# Patient Record
Sex: Male | Born: 1940
Health system: Southern US, Community
[De-identification: ages and names within clinical notes are randomized; demographics above are authoritative.]

## PROBLEM LIST (undated history)

## (undated) DIAGNOSIS — R413 Other amnesia: Secondary | ICD-10-CM

## (undated) DIAGNOSIS — F0390 Unspecified dementia without behavioral disturbance: Secondary | ICD-10-CM

## (undated) DIAGNOSIS — H269 Unspecified cataract: Secondary | ICD-10-CM

## (undated) DIAGNOSIS — C801 Malignant (primary) neoplasm, unspecified: Secondary | ICD-10-CM

## (undated) DIAGNOSIS — E785 Hyperlipidemia, unspecified: Secondary | ICD-10-CM

## (undated) DIAGNOSIS — E039 Hypothyroidism, unspecified: Secondary | ICD-10-CM

## (undated) DIAGNOSIS — C61 Malignant neoplasm of prostate: Secondary | ICD-10-CM

## (undated) DIAGNOSIS — K219 Gastro-esophageal reflux disease without esophagitis: Secondary | ICD-10-CM

## (undated) DIAGNOSIS — K635 Polyp of colon: Secondary | ICD-10-CM

## (undated) HISTORY — DX: Other amnesia: R41.3

## (undated) HISTORY — DX: Unspecified cataract: H26.9

## (undated) HISTORY — DX: Hyperlipidemia, unspecified: E78.5

## (undated) HISTORY — PX: OTHER SURGICAL HISTORY: SHX169

---

## 2005-07-04 ENCOUNTER — Ambulatory Visit (HOSPITAL_COMMUNITY): Admission: RE | Admit: 2005-07-04 | Discharge: 2005-07-04 | Payer: Self-pay | Admitting: Internal Medicine

## 2005-07-04 ENCOUNTER — Encounter (INDEPENDENT_AMBULATORY_CARE_PROVIDER_SITE_OTHER): Payer: Self-pay | Admitting: Internal Medicine

## 2005-07-04 ENCOUNTER — Ambulatory Visit: Payer: Self-pay | Admitting: Internal Medicine

## 2009-12-20 DIAGNOSIS — N529 Male erectile dysfunction, unspecified: Secondary | ICD-10-CM | POA: Insufficient documentation

## 2009-12-20 DIAGNOSIS — H9113 Presbycusis, bilateral: Secondary | ICD-10-CM | POA: Insufficient documentation

## 2009-12-20 DIAGNOSIS — M87051 Idiopathic aseptic necrosis of right femur: Secondary | ICD-10-CM | POA: Insufficient documentation

## 2010-03-22 DIAGNOSIS — M150 Primary generalized (osteo)arthritis: Secondary | ICD-10-CM | POA: Insufficient documentation

## 2010-07-12 ENCOUNTER — Ambulatory Visit: Admit: 2010-07-12 | Payer: Self-pay | Admitting: Gastroenterology

## 2010-07-25 ENCOUNTER — Encounter (INDEPENDENT_AMBULATORY_CARE_PROVIDER_SITE_OTHER): Payer: Self-pay

## 2010-07-25 DIAGNOSIS — I7300 Raynaud's syndrome without gangrene: Secondary | ICD-10-CM | POA: Insufficient documentation

## 2010-07-25 DIAGNOSIS — K449 Diaphragmatic hernia without obstruction or gangrene: Secondary | ICD-10-CM | POA: Insufficient documentation

## 2010-07-25 DIAGNOSIS — K2100 Gastro-esophageal reflux disease with esophagitis, without bleeding: Secondary | ICD-10-CM | POA: Insufficient documentation

## 2010-07-25 DIAGNOSIS — E7800 Pure hypercholesterolemia, unspecified: Secondary | ICD-10-CM | POA: Insufficient documentation

## 2010-07-26 DIAGNOSIS — R972 Elevated prostate specific antigen [PSA]: Secondary | ICD-10-CM | POA: Insufficient documentation

## 2010-08-17 NOTE — Letter (Signed)
Summary: Recall, Screening Colonoscopy Only  [facility]  [address]   [place], [place] [address]   Phone: [phone]  Fax: [phone]    July 25, 2010  Christian Johnson [address] [facility] [address] [place], [place]  [address] May 19, 1941   Dear Christian Johnson,   Our records indicate it is time to schedule your colonoscopy.    Please call our office at [phone] and ask for the nurse.   Thank you,  Hendricks Limes, LPN Cloria Spring, LPN  [facility] Ph: [phone]   Fax: [phone]

## 2010-12-01 NOTE — Op Note (Signed)
Christian Johnson, Christian Johnson                 ACCOUNT NO.:  [id number]   MEDICAL RECORD NO.:  [id number]          PATIENT TYPE:  AMB   LOCATION:  DAY                           FACILITY:  [facility]   PHYSICIAN:  Lionel December, M.D.    DATE OF BIRTH:  01-30-1941   DATE OF PROCEDURE:  07/04/2005  DATE OF DISCHARGE:                                 OPERATIVE REPORT   PROCEDURE:  Colonoscopy with polypectomy.   INDICATION:  [name] is a 70 year old Caucasian male who is here for screening  colonoscopy. Family history is negative for colorectal carcinoma.   Procedure and risks were reviewed with the patient, and informed consent was  obtained.   MEDICINES FOR CONSCIOUS SEDATION:  Demerol 25 mg IV, Versed 4 mg IV.   FINDINGS:  Procedure performed in endoscopy suite. The patient's vital signs  and O2 saturation were monitored during the procedure and remained stable.  The patient was placed in left lateral position and rectal examination  performed. No abnormality noted on external or digital exam. Olympus  videoscope was placed in rectum and advanced under vision in sigmoid colon  and beyond. Preparation was excellent. Scattered diverticula were noted  throughout the colon, but most of these were in the sigmoid colon. The scope  was passed in the cecum which was identified by ileocecal valve and  appendiceal orifice. Pictures taken for the record. As the scope was  withdrawn, colonic mucosa was carefully examined. There was a single 8-mm  pedunculated polyp at distal sigmoid colon which was snared and retrieved  for histologic examination. Rectal mucosa was normal. Scope was retroflexed  to examine anorectal junction, and small hemorrhoids were noted below the  dentate line. Endoscope was straightened and withdrawn. The patient  tolerated the procedure well.   FINAL DIAGNOSIS:  1.  An 8-mm pedunculated polyp snared from distal sigmoid colon.  2.  Pan colonic diverticulosis. Most of the diverticula,  however, were at      sigmoid colon.  3.  Small external hemorrhoids.   RECOMMENDATIONS:  High-fiber diet. He will resume his aspirin in one week  from now. I will be contacting the patient with biopsy results and further  recommendations.      Lionel December, M.D.  Electronically Signed     NR/MEDQ  D:  07/04/2005  T:  07/05/2005  Job:  [id number]

## 2011-01-01 ENCOUNTER — Other Ambulatory Visit (HOSPITAL_COMMUNITY): Payer: Self-pay | Admitting: Family Medicine

## 2011-01-01 DIAGNOSIS — Z87891 Personal history of nicotine dependence: Secondary | ICD-10-CM

## 2011-01-01 DIAGNOSIS — IMO0001 Reserved for inherently not codable concepts without codable children: Secondary | ICD-10-CM

## 2011-01-04 ENCOUNTER — Other Ambulatory Visit (HOSPITAL_COMMUNITY): Payer: Self-pay | Admitting: Family Medicine

## 2011-01-04 ENCOUNTER — Ambulatory Visit (HOSPITAL_COMMUNITY)
Admission: RE | Admit: 2011-01-04 | Discharge: 2011-01-04 | Disposition: A | Discharge status: Home / Self Care | Payer: Medicare Other | Source: Ambulatory Visit | Attending: Family Medicine | Admitting: Family Medicine

## 2011-01-04 DIAGNOSIS — IMO0001 Reserved for inherently not codable concepts without codable children: Secondary | ICD-10-CM

## 2011-01-04 DIAGNOSIS — R109 Unspecified abdominal pain: Secondary | ICD-10-CM | POA: Insufficient documentation

## 2011-01-04 DIAGNOSIS — Z87891 Personal history of nicotine dependence: Secondary | ICD-10-CM

## 2011-01-04 DIAGNOSIS — K8020 Calculus of gallbladder without cholecystitis without obstruction: Secondary | ICD-10-CM | POA: Insufficient documentation

## 2011-01-13 DIAGNOSIS — K8020 Calculus of gallbladder without cholecystitis without obstruction: Secondary | ICD-10-CM | POA: Insufficient documentation

## 2011-02-28 ENCOUNTER — Encounter (INDEPENDENT_AMBULATORY_CARE_PROVIDER_SITE_OTHER): Payer: Self-pay | Admitting: *Deleted

## 2011-11-19 ENCOUNTER — Encounter (INDEPENDENT_AMBULATORY_CARE_PROVIDER_SITE_OTHER): Payer: Self-pay | Admitting: *Deleted

## 2011-11-23 ENCOUNTER — Encounter (INDEPENDENT_AMBULATORY_CARE_PROVIDER_SITE_OTHER): Payer: Self-pay | Admitting: *Deleted

## 2011-11-23 ENCOUNTER — Telehealth (INDEPENDENT_AMBULATORY_CARE_PROVIDER_SITE_OTHER): Payer: Self-pay | Admitting: *Deleted

## 2011-11-23 ENCOUNTER — Other Ambulatory Visit (INDEPENDENT_AMBULATORY_CARE_PROVIDER_SITE_OTHER): Payer: Self-pay | Admitting: *Deleted

## 2011-11-23 DIAGNOSIS — Z86010 Personal history of colonic polyps: Secondary | ICD-10-CM

## 2011-11-23 MED ORDER — PEG-KCL-NACL-NASULF-NA ASC-C 100 G PO SOLR: 1.0000 | Freq: Once | ORAL | Status: DC

## 2011-11-23 NOTE — Telephone Encounter (Signed)
Patient needs movi prep 

## 2011-12-06 ENCOUNTER — Telehealth (INDEPENDENT_AMBULATORY_CARE_PROVIDER_SITE_OTHER): Payer: Self-pay | Admitting: *Deleted

## 2011-12-06 MED ORDER — PEG 3350-KCL-NA BICARB-NACL 420 G PO SOLR: 4000.0000 mL | Freq: Once | ORAL | Status: AC

## 2011-12-06 NOTE — Telephone Encounter (Signed)
Patient wants tri-lyte, it's cheaper than movi prep

## 2012-01-03 ENCOUNTER — Telehealth (INDEPENDENT_AMBULATORY_CARE_PROVIDER_SITE_OTHER): Payer: Self-pay | Admitting: *Deleted

## 2012-01-03 NOTE — Telephone Encounter (Signed)
PCP/Requesting MD: Wonda Horner  Name & DOB: Christian Johnson     Procedure: tcs  Reason/Indication:  Hx polyps  Has patient had this procedure before?  yes  If so, when, by whom and where?  12/2004  Is there a family history of colon cancer?  no  Who?  What age when diagnosed?    Is patient diabetic?   no      Does patient have prosthetic heart valve?  no  Do you have a pacemaker?  no  Has patient had joint replacement within last 12 months?  no  Is patient on Coumadin, Plavix and/or Aspirin? yes  Medications: asa 81 mg prn, omeprazole 20 mg daily, levothyroxine 88 mg daily, centrum silver, mega red fish oil   Allergies: none  Medication Adjustment: asa 2 days  Procedure date & time: 01/31/12 at 930

## 2012-01-04 NOTE — Telephone Encounter (Signed)
agree

## 2012-01-16 DIAGNOSIS — C61 Malignant neoplasm of prostate: Secondary | ICD-10-CM | POA: Insufficient documentation

## 2012-01-18 ENCOUNTER — Encounter (HOSPITAL_COMMUNITY): Payer: Self-pay | Admitting: Pharmacy Technician

## 2012-01-31 ENCOUNTER — Encounter (HOSPITAL_COMMUNITY)
Admission: RE | Disposition: A | Discharge status: Home Health | Payer: Self-pay | Source: Ambulatory Visit | Attending: Internal Medicine

## 2012-01-31 ENCOUNTER — Encounter (HOSPITAL_COMMUNITY): Payer: Self-pay | Admitting: *Deleted

## 2012-01-31 ENCOUNTER — Ambulatory Visit (HOSPITAL_COMMUNITY)
Admission: RE | Admit: 2012-01-31 | Discharge: 2012-01-31 | Disposition: A | Discharge status: Home Health | Payer: Medicare Other | Source: Ambulatory Visit | Attending: Internal Medicine | Admitting: Internal Medicine

## 2012-01-31 DIAGNOSIS — K644 Residual hemorrhoidal skin tags: Secondary | ICD-10-CM | POA: Insufficient documentation

## 2012-01-31 DIAGNOSIS — D126 Benign neoplasm of colon, unspecified: Secondary | ICD-10-CM | POA: Insufficient documentation

## 2012-01-31 DIAGNOSIS — Z8371 Family history of colonic polyps: Secondary | ICD-10-CM

## 2012-01-31 DIAGNOSIS — K5730 Diverticulosis of large intestine without perforation or abscess without bleeding: Secondary | ICD-10-CM | POA: Insufficient documentation

## 2012-01-31 DIAGNOSIS — Z860100 Personal history of colon polyps, unspecified: Secondary | ICD-10-CM | POA: Insufficient documentation

## 2012-01-31 DIAGNOSIS — K635 Polyp of colon: Secondary | ICD-10-CM | POA: Insufficient documentation

## 2012-01-31 DIAGNOSIS — Z09 Encounter for follow-up examination after completed treatment for conditions other than malignant neoplasm: Secondary | ICD-10-CM | POA: Insufficient documentation

## 2012-01-31 HISTORY — DX: Malignant (primary) neoplasm, unspecified: C80.1

## 2012-01-31 HISTORY — DX: Hypothyroidism, unspecified: E03.9

## 2012-01-31 HISTORY — DX: Polyp of colon: K63.5

## 2012-01-31 HISTORY — DX: Gastro-esophageal reflux disease without esophagitis: K21.9

## 2012-01-31 HISTORY — PX: COLONOSCOPY: SHX5424

## 2012-01-31 SURGERY — COLONOSCOPY
Anesthesia: Moderate Sedation

## 2012-01-31 MED ORDER — SODIUM CHLORIDE 0.45 % IV SOLN
Freq: Once | INTRAVENOUS | Status: AC
  Administered 2012-01-31: 09:00:00 via INTRAVENOUS

## 2012-01-31 MED ORDER — MEPERIDINE HCL 50 MG/ML IJ SOLN
INTRAMUSCULAR | Status: DC | PRN
  Administered 2012-01-31: 25 mg via INTRAVENOUS

## 2012-01-31 MED ORDER — MEPERIDINE HCL 50 MG/ML IJ SOLN
INTRAMUSCULAR | Status: AC
  Filled 2012-01-31: qty 1

## 2012-01-31 MED ORDER — STERILE WATER FOR IRRIGATION IR SOLN
Status: DC | PRN
  Administered 2012-01-31: 10:00:00

## 2012-01-31 MED ORDER — MIDAZOLAM HCL 5 MG/5ML IJ SOLN
INTRAMUSCULAR | Status: AC
  Filled 2012-01-31: qty 10

## 2012-01-31 MED ORDER — MIDAZOLAM HCL 5 MG/5ML IJ SOLN
INTRAMUSCULAR | Status: DC | PRN
  Administered 2012-01-31: 1 mg via INTRAVENOUS
  Administered 2012-01-31 (×2): 2 mg via INTRAVENOUS

## 2012-01-31 NOTE — H&P (Signed)
Christian Johnson is an 71 y.o. male.   Chief Complaint: Patient is here for colonoscopy. HPI: Patient is 71 year old Caucasian male with history of colonic polyps. His last exam was in 2006 with removal of single tubulovillous adenoma. He is here for surveillance colonoscopy. He denies abdominal pain change in his bowel habits or rectal bleeding. Family history is negative for colorectal carcinoma.  Past Medical History  Diagnosis Date  . Hypothyroidism   . GERD (gastroesophageal reflux disease)   . Colon polyps   . Cancer     Prostate    Past Surgical History  Procedure Date  . Right hip core decompression   . Colonoscopy with polyp removal     Family History  Problem Relation Age of Onset  . Colon cancer Neg Hx    Social History:  reports that he has quit smoking. His smoking use included Cigarettes. He has a 25 pack-year smoking history. He does not have any smokeless tobacco history on file. He reports that he does not drink alcohol or use illicit drugs.  Allergies: No Known Allergies  Medications Prior to Admission  Medication Sig Dispense Refill  . Krill Oil 500 MG CAPS Take 1 capsule by mouth daily.      Marland Kitchen levothyroxine (SYNTHROID, LEVOTHROID) 88 MCG tablet Take 88 mcg by mouth daily.      . Multiple Vitamin (MULTIVITAMIN WITH MINERALS) TABS Take 1 tablet by mouth daily.      Marland Kitchen omeprazole (PRILOSEC) 20 MG capsule Take 20 mg by mouth daily.      . peg 3350 powder (MOVIPREP) 100 G SOLR Take 1 kit (100 g total) by mouth once.  1 kit  0    No results found for this or any previous visit (from the past 48 hour(s)). No results found.  ROS  Blood pressure 175/85, pulse 86, temperature 97.9 F (36.6 C), temperature source Oral, resp. rate 21, height 6\' 1"  (1.854 m), weight 185 lb (83.915 kg), SpO2 95.00%. Physical Exam  Constitutional: He appears well-developed and well-nourished.  HENT:  Mouth/Throat: Oropharynx is clear and moist.  Eyes: Conjunctivae are normal. No  scleral icterus.  Neck: No thyromegaly present.  Cardiovascular: Normal rate, regular rhythm and normal heart sounds.   No murmur heard. Respiratory: Effort normal and breath sounds normal.  GI: Soft. He exhibits no distension and no mass. There is no tenderness.  Musculoskeletal: He exhibits no edema.  Lymphadenopathy:    He has no cervical adenopathy.  Neurological: He is alert.  Skin: Skin is warm and dry.     Assessment/Plan History of colonic adenoma. Surveillance colonoscopy  Khaylee Mcevoy U 01/31/2012, 9:48 AM

## 2012-01-31 NOTE — Op Note (Signed)
COLONOSCOPY PROCEDURE REPORT  PATIENT:  Christian Johnson  MR#:  [id number] Birthdate:  02/06/1941, 71 y.o., male Endoscopist:  Dr. Malissa Hippo, MD Referred By:  Dr. Mila Homer. Sudie Bailey, M.D. Procedure Date: 01/31/2012  Procedure:   Colonoscopy snare polypectomy.  Indications:  Patient is 71 year old Caucasian male with history of colonic adenoma. He is undergoing surveillance colonoscopy.  Informed Consent:  The procedure and risks were reviewed with the patient and informed consent was obtained.  Medications:  Demerol 25 mg IV Versed 5 mg IV  Description of procedure:  After a digital rectal exam was performed, that colonoscope was advanced from the anus through the rectum and colon to the area of the cecum, ileocecal valve and appendiceal orifice. The cecum was deeply intubated. These structures were well-seen and photographed for the record. From the level of the cecum and ileocecal valve, the scope was slowly and cautiously withdrawn. The mucosal surfaces were carefully surveyed utilizing scope tip to flexion to facilitate fold flattening as needed. The scope was pulled down into the rectum where a thorough exam including retroflexion was performed.  Findings:   Prep excellent. Scattered diverticula throughout the colon. Three small polyps ablated via cold biopsy from hepatic flexure and submitted together. Four small polyps ablated via cold biopsy from transverse colon and submitted together. 10 mm polyp snared from distal sigmoid colon. Normal rectal mucosa. Small hemorrhoids below the dentate line.  Therapeutic/Diagnostic Maneuvers Performed:  See above.  Complications:  None  Cecal Withdrawal Time:  21 minutes  Impression:  Examination performed to cecum. Pancolonic diverticulosis. Three small polyps ablated via cold biopsy from hepatic flexure and submitted together. Four small polyps ablated via cold biopsy from transverse colon and submitted together. 10 mm polyp  snared from sigmoid colon. Small external hemorrhoids.   Recommendations:  Standard instructions given. I will contact patient with biopsy results and further recommendations.  Rainah Kirshner U  01/31/2012 10:39 AM  CC: Dr. Milana Obey, MD & Dr. Bonnetta Barry ref. provider found

## 2012-02-06 ENCOUNTER — Encounter (HOSPITAL_COMMUNITY): Payer: Self-pay | Admitting: Internal Medicine

## 2012-02-06 ENCOUNTER — Encounter (INDEPENDENT_AMBULATORY_CARE_PROVIDER_SITE_OTHER): Payer: Self-pay | Admitting: *Deleted

## 2012-03-03 ENCOUNTER — Encounter (HOSPITAL_COMMUNITY): Payer: Self-pay

## 2012-03-03 ENCOUNTER — Emergency Department (HOSPITAL_COMMUNITY): Payer: Medicare Other

## 2012-03-03 ENCOUNTER — Emergency Department (HOSPITAL_COMMUNITY)
Admission: EM | Admit: 2012-03-03 | Discharge: 2012-03-03 | Disposition: A | Discharge status: Home / Self Care | Payer: Medicare Other | Attending: Emergency Medicine | Admitting: Emergency Medicine

## 2012-03-03 DIAGNOSIS — Z87891 Personal history of nicotine dependence: Secondary | ICD-10-CM | POA: Insufficient documentation

## 2012-03-03 DIAGNOSIS — Z8546 Personal history of malignant neoplasm of prostate: Secondary | ICD-10-CM | POA: Insufficient documentation

## 2012-03-03 DIAGNOSIS — Y9289 Other specified places as the place of occurrence of the external cause: Secondary | ICD-10-CM | POA: Insufficient documentation

## 2012-03-03 DIAGNOSIS — E039 Hypothyroidism, unspecified: Secondary | ICD-10-CM | POA: Insufficient documentation

## 2012-03-03 DIAGNOSIS — S62109A Fracture of unspecified carpal bone, unspecified wrist, initial encounter for closed fracture: Secondary | ICD-10-CM | POA: Insufficient documentation

## 2012-03-03 DIAGNOSIS — R071 Chest pain on breathing: Secondary | ICD-10-CM | POA: Insufficient documentation

## 2012-03-03 DIAGNOSIS — R0789 Other chest pain: Secondary | ICD-10-CM

## 2012-03-03 DIAGNOSIS — S62101A Fracture of unspecified carpal bone, right wrist, initial encounter for closed fracture: Secondary | ICD-10-CM

## 2012-03-03 DIAGNOSIS — K219 Gastro-esophageal reflux disease without esophagitis: Secondary | ICD-10-CM | POA: Insufficient documentation

## 2012-03-03 DIAGNOSIS — W19XXXA Unspecified fall, initial encounter: Secondary | ICD-10-CM | POA: Insufficient documentation

## 2012-03-03 HISTORY — DX: Malignant neoplasm of prostate: C61

## 2012-03-03 MED ORDER — HYDROCODONE-ACETAMINOPHEN 5-325 MG PO TABS: 1.0000 | ORAL_TABLET | ORAL | Status: AC | PRN

## 2012-03-03 NOTE — ED Notes (Signed)
Slipped and fell on wet grass this afternoon around 1230. Hurting in right forearm, wrist, and hand. Fell on left side and landed on left arm with arm against chest. Hurting in left chest, hurts to laugh and take a deep breath per pt.

## 2012-03-03 NOTE — ED Provider Notes (Signed)
History     CSN: [id number]  Arrival date & time 03/03/12  1956   First MD Initiated Contact with Patient 03/03/12 2013      Chief Complaint  Patient presents with  . Fall    (Consider location/radiation/quality/duration/timing/severity/associated sxs/prior treatment) HPI...Marland Kitchenaccidental fall at 12:30 today on wet grass.  Right arm was tucked under his chest.  Complains of right wrist and right chest wall pain. Movement and deep breaths makes it worse. Pain is moderate. No head or neck trauma.  Past Medical History  Diagnosis Date  . Hypothyroidism   . GERD (gastroesophageal reflux disease)   . Colon polyps   . Cancer     Prostate  . Prostate cancer     Past Surgical History  Procedure Date  . Right hip core decompression   . Colonoscopy with polyp removal   . Colonoscopy 01/31/2012    Procedure: COLONOSCOPY;  Surgeon: Malissa Hippo, MD;  Location: [facility];  Service: Endoscopy;  Laterality: N/A;  930    Family History  Problem Relation Age of Onset  . Colon cancer Neg Hx     History  Substance Use Topics  . Smoking status: Former Smoker -- 1.0 packs/day for 25 years    Types: Cigarettes  . Smokeless tobacco: Not on file  . Alcohol Use: No      Review of Systems  All other systems reviewed and are negative.    Allergies  Review of patient's allergies indicates no known allergies.  Home Medications   Current Outpatient Rx  Name Route Sig Dispense Refill  . KRILL OIL 500 MG PO CAPS Oral Take 1 capsule by mouth daily.    Marland Kitchen LEVOTHYROXINE SODIUM 88 MCG PO TABS Oral Take 88 mcg by mouth every morning.     . ADULT MULTIVITAMIN W/MINERALS CH Oral Take 1 tablet by mouth daily.    Marland Kitchen NAPROXEN SODIUM 220 MG PO CAPS Oral Take 440 mg by mouth once as needed. For pain    . OMEPRAZOLE 20 MG PO CPDR Oral Take 20 mg by mouth every morning.     Marland Kitchen HYDROCODONE-ACETAMINOPHEN 5-325 MG PO TABS Oral Take 1 tablet by mouth every 4 (four) hours as needed for pain. 20  tablet 0    BP 160/81  Pulse 91  Temp 97.8 F (36.6 C) (Oral)  Resp 18  Ht 6\' 1"  (1.854 m)  Wt 188 lb (85.276 kg)  BMI 24.80 kg/m2  SpO2 97%  Physical Exam  Nursing note and vitals reviewed. Constitutional: He is oriented to person, place, and time. He appears well-developed and well-nourished.  HENT:  Head: Normocephalic and atraumatic.  Eyes: Conjunctivae and EOM are normal. Pupils are equal, round, and reactive to light.  Neck: Normal range of motion. Neck supple.  Cardiovascular: Normal rate and regular rhythm.   Pulmonary/Chest: Effort normal and breath sounds normal.       Tender right anterior lateral chest wall  Abdominal: Soft. Bowel sounds are normal.  Musculoskeletal:       Tender to palpation dorsum right wrist, pain with flexion and extension  Neurological: He is alert and oriented to person, place, and time.  Skin: Skin is warm and dry.  Psychiatric: He has a normal mood and affect.    ED Course  Procedures (including critical care time)  Labs Reviewed - No data to display Dg Ribs Unilateral W/chest Right  03/03/2012  *RADIOLOGY REPORT*  Clinical Data: Right anterior chest pain after fall.  History of  prostate cancer.  RIGHT RIBS AND CHEST - 3+ VIEW  Comparison: None.  Findings: The heart size and pulmonary vascularity are normal. The lungs appear clear and expanded without focal air space disease or consolidation. No blunting of the costophrenic angles.  No pneumothorax.  Slight fibrosis in the lung bases.  The mediastinal contours appear intact.  Right ribs appear intact.  No displaced fractures or focal bone lesions identified.  IMPRESSION: No evidence of active pulmonary disease.  Right ribs appear intact.   Original Report Authenticated By: Marlon Pel, M.D.    Dg Wrist Complete Right  03/03/2012  *RADIOLOGY REPORT*  Clinical Data: Right wrist pain and swelling after fall.  History of prostate cancer.  RIGHT WRIST - COMPLETE 3+ VIEW  Comparison: None.   Findings: Mild degenerative changes in the STT joints.  Dorsal soft tissue swelling over the wrist with suggestion of tiny linear fracture of the distal carpal bones, likely representing the hamate.  This suggests a nondisplaced fracture of the dorsal aspect of the hamate.  Right wrist appears otherwise intact.  No additional fractures identified. No focal bone lesion or sclerosis.  IMPRESSION: Nondisplaced fracture of a distal carpal bones seen dorsally, likely to represent the hamate.  Soft tissue swelling.   Original Report Authenticated By: Marlon Pel, M.D.      1. Fall   2. Chest wall pain   3. Right wrist fracture       MDM  X-ray right ribs negative. X-ray of right wrist shows possible hamate fracture.  Will splint rest, refer to orthopedics.        Donnetta Hutching, MD 03/03/12 2237

## 2012-12-31 DIAGNOSIS — L570 Actinic keratosis: Secondary | ICD-10-CM | POA: Insufficient documentation

## 2015-11-03 ENCOUNTER — Other Ambulatory Visit (HOSPITAL_COMMUNITY): Payer: Self-pay | Admitting: Orthopaedic Surgery

## 2015-11-03 DIAGNOSIS — R52 Pain, unspecified: Secondary | ICD-10-CM

## 2015-11-08 ENCOUNTER — Ambulatory Visit (HOSPITAL_COMMUNITY)
Admission: RE | Admit: 2015-11-08 | Discharge: 2015-11-08 | Disposition: A | Discharge status: Home / Self Care | Payer: Medicare Other | Source: Ambulatory Visit | Attending: Orthopaedic Surgery | Admitting: Orthopaedic Surgery

## 2015-11-08 DIAGNOSIS — R52 Pain, unspecified: Secondary | ICD-10-CM

## 2015-11-08 DIAGNOSIS — M7551 Bursitis of right shoulder: Secondary | ICD-10-CM | POA: Diagnosis not present

## 2015-11-08 DIAGNOSIS — M25811 Other specified joint disorders, right shoulder: Secondary | ICD-10-CM | POA: Diagnosis not present

## 2015-11-08 DIAGNOSIS — M19011 Primary osteoarthritis, right shoulder: Secondary | ICD-10-CM | POA: Insufficient documentation

## 2015-11-08 DIAGNOSIS — M25511 Pain in right shoulder: Secondary | ICD-10-CM | POA: Diagnosis present

## 2015-11-16 ENCOUNTER — Ambulatory Visit: Payer: Medicare Other | Admitting: Physical Therapy

## 2017-01-17 ENCOUNTER — Encounter (INDEPENDENT_AMBULATORY_CARE_PROVIDER_SITE_OTHER): Payer: Self-pay | Admitting: *Deleted

## 2017-01-31 ENCOUNTER — Other Ambulatory Visit (INDEPENDENT_AMBULATORY_CARE_PROVIDER_SITE_OTHER): Payer: Self-pay | Admitting: Internal Medicine

## 2017-01-31 DIAGNOSIS — Z860100 Personal history of colon polyps, unspecified: Secondary | ICD-10-CM | POA: Insufficient documentation

## 2017-04-16 ENCOUNTER — Encounter (INDEPENDENT_AMBULATORY_CARE_PROVIDER_SITE_OTHER): Payer: Self-pay | Admitting: *Deleted

## 2017-04-16 ENCOUNTER — Telehealth (INDEPENDENT_AMBULATORY_CARE_PROVIDER_SITE_OTHER): Payer: Self-pay | Admitting: *Deleted

## 2017-04-16 NOTE — Telephone Encounter (Signed)
Patient needs trilyte 

## 2017-04-17 MED ORDER — PEG 3350-KCL-NA BICARB-NACL 420 G PO SOLR: 4000.0000 mL | Freq: Once | ORAL | 0 refills | Status: AC

## 2017-05-10 ENCOUNTER — Telehealth (INDEPENDENT_AMBULATORY_CARE_PROVIDER_SITE_OTHER): Payer: Self-pay | Admitting: *Deleted

## 2017-05-10 NOTE — Telephone Encounter (Signed)
Referring MD/PCP: nyland   Procedure: tcs  Reason/Indication:  Hx polyps  Has patient had this procedure before?  Yes, 2013  If so, when, by whom and where?    Is there a family history of colon cancer?  no  Who?  What age when diagnosed?    Is patient diabetic?   no      Does patient have prosthetic heart valve or mechanical valve?  no  Do you have a pacemaker?  no  Has patient ever had endocarditis? no  Has patient had joint replacement within last 12 months?  no  Is patient constipated or take laxatives? no  Does patient have a history of alcohol/drug use?  no  Is patient on Coumadin, Plavix and/or Aspirin? no  Medications: omeprazole 20 mg daily, levothyroxine 88 mcg daily, fish oil  Allergies: nkda  Medication Adjustment per Dr Laural Golden:   Procedure date & time: 06/05/17 at 1030

## 2017-05-14 NOTE — Telephone Encounter (Signed)
agree

## 2017-06-05 ENCOUNTER — Encounter (HOSPITAL_COMMUNITY): Payer: Self-pay

## 2017-06-05 ENCOUNTER — Ambulatory Visit (HOSPITAL_COMMUNITY): Admit: 2017-06-05 | Payer: Medicare Other | Admitting: Internal Medicine

## 2017-06-05 SURGERY — COLONOSCOPY
Anesthesia: Moderate Sedation

## 2017-06-19 ENCOUNTER — Other Ambulatory Visit (INDEPENDENT_AMBULATORY_CARE_PROVIDER_SITE_OTHER): Payer: Self-pay | Admitting: *Deleted

## 2017-06-19 DIAGNOSIS — Z860100 Personal history of colon polyps, unspecified: Secondary | ICD-10-CM | POA: Insufficient documentation

## 2017-09-16 ENCOUNTER — Encounter (INDEPENDENT_AMBULATORY_CARE_PROVIDER_SITE_OTHER): Payer: Self-pay | Admitting: *Deleted

## 2017-09-16 ENCOUNTER — Telehealth (INDEPENDENT_AMBULATORY_CARE_PROVIDER_SITE_OTHER): Payer: Self-pay | Admitting: *Deleted

## 2017-09-16 MED ORDER — PEG 3350-KCL-NA BICARB-NACL 420 G PO SOLR: 4000.0000 mL | Freq: Once | ORAL | 0 refills | Status: AC

## 2017-09-16 NOTE — Telephone Encounter (Signed)
Patient needs trilyte 

## 2017-09-18 ENCOUNTER — Ambulatory Visit (INDEPENDENT_AMBULATORY_CARE_PROVIDER_SITE_OTHER): Payer: Medicare Other | Admitting: Orthopaedic Surgery

## 2017-09-18 ENCOUNTER — Ambulatory Visit (INDEPENDENT_AMBULATORY_CARE_PROVIDER_SITE_OTHER): Payer: Self-pay

## 2017-09-18 ENCOUNTER — Encounter (INDEPENDENT_AMBULATORY_CARE_PROVIDER_SITE_OTHER): Payer: Self-pay | Admitting: Orthopaedic Surgery

## 2017-09-18 VITALS — BP 175/95 | HR 70 | Resp 16 | Ht 72.0 in | Wt 180.0 lb

## 2017-09-18 DIAGNOSIS — M25511 Pain in right shoulder: Secondary | ICD-10-CM | POA: Diagnosis not present

## 2017-09-18 DIAGNOSIS — G8929 Other chronic pain: Secondary | ICD-10-CM

## 2017-09-18 MED ORDER — METHYLPREDNISOLONE ACETATE 40 MG/ML IJ SUSP
80.0000 mg | INTRAMUSCULAR | Status: AC | PRN
  Administered 2017-09-18: 80 mg

## 2017-09-18 MED ORDER — BUPIVACAINE HCL 0.5 % IJ SOLN
2.0000 mL | INTRAMUSCULAR | Status: AC | PRN
  Administered 2017-09-18: 2 mL via INTRA_ARTICULAR

## 2017-09-18 MED ORDER — LIDOCAINE HCL 2 % IJ SOLN
2.0000 mL | INTRAMUSCULAR | Status: AC | PRN
  Administered 2017-09-18: 2 mL

## 2017-09-18 NOTE — Progress Notes (Signed)
Office Visit Note   Patient: Christian Johnson           Date of Birth: Mar 04, 1941           MRN: [id number] Visit Date: 09/18/2017              Requested by: Christian Housekeeper, MD [address] [place], [place] [address] PCP: Christian Housekeeper, MD   Assessment & Plan: Visit Diagnoses:  1. Chronic right shoulder pain     Plan: Acute on chronic right shoulder pain. He certainly could have a rotator cuff tear. I'm going to inject the subacromial region of his right shoulder and monitor his response. Also having some numbness and tingling into his right hand was some weakness which may be related to his shoulder but he certainly could have a peripheral neuropathy. I would suggest EMGs and nerve conduction studies if the injection in his shoulder doesn't help over the next 3-4 weeks. He'll call  Follow-Up Instructions: Return if symptoms worsen or fail to improve.   Orders:  Orders Placed This Encounter  Procedures  . Large Joint Inj: R subacromial bursa  . XR Shoulder Right   No orders of the defined types were placed in this encounter.     Procedures: Large Joint Inj: R subacromial bursa on 09/18/2017 10:56 AM Indications: pain and diagnostic evaluation Details: 25 G 1.5 in needle, anterolateral approach  Arthrogram: No  Medications: 2 mL lidocaine 2 %; 2 mL bupivacaine 0.5 %; 80 mg methylPREDNISolone acetate 40 MG/ML Consent was given by the patient. Immediately prior to procedure a time out was called to verify the correct patient, procedure, equipment, support staff and site/side marked as required. Patient was prepped and draped in the usual sterile fashion.       Clinical Data: No additional findings.   Subjective: Chief Complaint  Patient presents with  . Right Shoulder - Pain    Christian Johnson is 77 y o m here for r shoulder pain, radiates down arm and causing tingling in in hand. Noticed it after painting 2 weeks ago. Pt. aslo states has pain at the base of the neck.    Accompanied by wife and here for evaluation of right upper extremity pain. Christian Johnson relates he was painting a wall with a roller brush about 2 months ago and had a "little shoulder pain. He's had a prior history of shoulder discomfort that I have it injected in the past with good relief. About a month ago he fell off a ladder and had recurrent pain in his right shoulder. He is not sure if painting walls or falling off a ladder is responsible for his present pain but for the past 2 weeks he's been having some shoulder pain with limited motion overhead. Has also developed some numbness and tingling into his right hand associated with some weakness. He denies any neck pain.  HPI  Review of Systems  Constitutional: Negative for fatigue and fever.  HENT: Negative for ear pain.   Eyes: Negative for pain.  Respiratory: Negative for cough.   Cardiovascular: Negative for leg swelling.  Gastrointestinal: Negative for constipation and diarrhea.  Genitourinary: Negative for dysuria.  Musculoskeletal: Positive for neck pain and neck stiffness. Negative for back pain.  Skin: Negative for rash.  Neurological: Positive for weakness and numbness. Negative for dizziness.  Psychiatric/Behavioral: Negative for sleep disturbance.     Objective: Vital Signs: BP (!) 175/95 (BP Location: Left Arm, Patient Position: Sitting, Cuff Size: Normal)  Pulse 70   Resp 16   Ht 6' (1.829 m)   Wt 180 lb (81.6 kg)   BMI 24.41 kg/m   Physical Exam  Ortho Exam awake alert and oriented 3. Comfortable sitting. No pain with range of motion of the cervical spine below was a little limited. At no referred pain to the right shoulder. Minimally painful motion of right shoulder overhead. No pain at the acromioclavicular joint. Skin intact. No swelling about the right shoulder. Negative empty can testing minimal discomfort with impingement. Reasonable grip and release lower thought he was weak with opposition of thumb to his  little finger. Some tingling over the median nerve. Good sensibility good capillary refill to the fingers.  Specialty Comments:  No specialty comments available.  Imaging: Xr Shoulder Right  Result Date: 09/18/2017 Films of the right shoulder reveal no evidence of fracture or dislocation. There is a normal space between the humeral head and acromion. There are degenerative changes at the acromioclavicular joint. No ectopic calcification.    PMFS History: Patient Active Problem List   Diagnosis Date Noted  . History of colonic polyps 06/19/2017  . Hx of colonic polyps 01/31/2017   Past Medical History:  Diagnosis Date  . Cancer [facility])    Prostate  . Colon polyps   . GERD (gastroesophageal reflux disease)   . Hypothyroidism   . Prostate cancer [facility])     Family History  Problem Relation Age of Onset  . Colon cancer Neg Hx     Past Surgical History:  Procedure Laterality Date  . COLONOSCOPY  01/31/2012   Procedure: COLONOSCOPY;  Surgeon: Christian Houston, MD;  Location: [facility];  Service: Endoscopy;  Laterality: N/A;  930  . Colonoscopy with polyp removal    . Right hip core decompression     Social History   Occupational History  . Not on file  Tobacco Use  . Smoking status: Former Smoker    Packs/day: 1.00    Years: 25.00    Pack years: 25.00    Types: Cigarettes  Substance and Sexual Activity  . Alcohol use: No  . Drug use: No  . Sexual activity: Not on file

## 2017-10-09 ENCOUNTER — Telehealth (INDEPENDENT_AMBULATORY_CARE_PROVIDER_SITE_OTHER): Payer: Self-pay | Admitting: *Deleted

## 2017-10-09 NOTE — Telephone Encounter (Signed)
Referring MD/PCP: nyland   Procedure: tcs  Reason/Indication:  Hx polyps  Has patient had this procedure before?  Yes, 2013             If so, when, by whom and where?    Is there a family history of colon cancer?  no             Who?  What age when diagnosed?    Is patient diabetic?   no                                                  Does patient have prosthetic heart valve or mechanical valve?  no  Do you have a pacemaker?  no  Has patient ever had endocarditis? no  Has patient had joint replacement within last 12 months?  no  Is patient constipated or take laxatives? no  Does patient have a history of alcohol/drug use?  no  Is patient on Coumadin, Plavix and/or Aspirin? no  Medications: omeprazole 20 mg daily, levothyroxine 88 mcg daily, fish oil  Allergies: nkda  Medication Adjustment per Dr Laural Golden:   Procedure date & time: 11/06/17 at 830

## 2017-10-09 NOTE — Telephone Encounter (Signed)
agree

## 2017-10-14 ENCOUNTER — Other Ambulatory Visit (INDEPENDENT_AMBULATORY_CARE_PROVIDER_SITE_OTHER): Payer: Self-pay | Admitting: Radiology

## 2017-10-14 ENCOUNTER — Telehealth (INDEPENDENT_AMBULATORY_CARE_PROVIDER_SITE_OTHER): Payer: Self-pay | Admitting: Orthopaedic Surgery

## 2017-10-14 DIAGNOSIS — M79601 Pain in right arm: Secondary | ICD-10-CM

## 2017-10-14 NOTE — Telephone Encounter (Signed)
PLEASE ADVISE.

## 2017-10-14 NOTE — Progress Notes (Signed)
amb ref °

## 2017-10-14 NOTE — Telephone Encounter (Signed)
Whichever is more convenient

## 2017-10-14 NOTE — Telephone Encounter (Signed)
Patient's wife Christian Johnson called stating that Ulrich saw Dr. Durward Fortes on 3/6 in [place].   Patient is not sure if he should make an appointment in [place] or [place] based on needing further testing.

## 2017-10-14 NOTE — Telephone Encounter (Signed)
Yes to nerve EMG's and NCV's right arm

## 2017-10-14 NOTE — Telephone Encounter (Signed)
Would like to get nerve test that was discussed at last appt. on 09-18-17. Would you like to see them in [place] office first or can we just send in the referral? Please advise. Thank you.

## 2017-10-15 NOTE — Telephone Encounter (Signed)
SENT IN REFERRAL FOR EMG AND VCV'S

## 2017-11-06 ENCOUNTER — Ambulatory Visit (HOSPITAL_COMMUNITY)
Admission: RE | Admit: 2017-11-06 | Discharge: 2017-11-06 | Disposition: A | Discharge status: Home / Self Care | Payer: Medicare Other | Source: Ambulatory Visit | Attending: Internal Medicine | Admitting: Internal Medicine

## 2017-11-06 ENCOUNTER — Other Ambulatory Visit: Payer: Self-pay

## 2017-11-06 ENCOUNTER — Encounter (HOSPITAL_COMMUNITY)
Admission: RE | Disposition: A | Discharge status: Home / Self Care | Payer: Self-pay | Source: Ambulatory Visit | Attending: Internal Medicine

## 2017-11-06 ENCOUNTER — Encounter (HOSPITAL_COMMUNITY): Payer: Self-pay | Admitting: *Deleted

## 2017-11-06 DIAGNOSIS — Z79899 Other long term (current) drug therapy: Secondary | ICD-10-CM | POA: Diagnosis not present

## 2017-11-06 DIAGNOSIS — Z87891 Personal history of nicotine dependence: Secondary | ICD-10-CM | POA: Insufficient documentation

## 2017-11-06 DIAGNOSIS — E039 Hypothyroidism, unspecified: Secondary | ICD-10-CM | POA: Insufficient documentation

## 2017-11-06 DIAGNOSIS — Z8546 Personal history of malignant neoplasm of prostate: Secondary | ICD-10-CM | POA: Diagnosis not present

## 2017-11-06 DIAGNOSIS — Z1211 Encounter for screening for malignant neoplasm of colon: Secondary | ICD-10-CM | POA: Diagnosis present

## 2017-11-06 DIAGNOSIS — K5730 Diverticulosis of large intestine without perforation or abscess without bleeding: Secondary | ICD-10-CM | POA: Insufficient documentation

## 2017-11-06 DIAGNOSIS — K621 Rectal polyp: Secondary | ICD-10-CM | POA: Insufficient documentation

## 2017-11-06 DIAGNOSIS — D120 Benign neoplasm of cecum: Secondary | ICD-10-CM | POA: Insufficient documentation

## 2017-11-06 DIAGNOSIS — Z86010 Personal history of colonic polyps: Secondary | ICD-10-CM

## 2017-11-06 DIAGNOSIS — Z09 Encounter for follow-up examination after completed treatment for conditions other than malignant neoplasm: Secondary | ICD-10-CM | POA: Diagnosis not present

## 2017-11-06 DIAGNOSIS — D123 Benign neoplasm of transverse colon: Secondary | ICD-10-CM | POA: Insufficient documentation

## 2017-11-06 HISTORY — PX: POLYPECTOMY: SHX5525

## 2017-11-06 HISTORY — PX: COLONOSCOPY: SHX5424

## 2017-11-06 SURGERY — COLONOSCOPY
Anesthesia: Moderate Sedation

## 2017-11-06 MED ORDER — SODIUM CHLORIDE 0.9 % IV SOLN
INTRAVENOUS | Status: DC
  Administered 2017-11-06: 1000 mL via INTRAVENOUS

## 2017-11-06 MED ORDER — MEPERIDINE HCL 50 MG/ML IJ SOLN
INTRAMUSCULAR | Status: AC
  Filled 2017-11-06: qty 1

## 2017-11-06 MED ORDER — MIDAZOLAM HCL 5 MG/5ML IJ SOLN
INTRAMUSCULAR | Status: AC
  Filled 2017-11-06: qty 10

## 2017-11-06 MED ORDER — STERILE WATER FOR IRRIGATION IR SOLN
Status: DC | PRN
  Administered 2017-11-06: 15 mL

## 2017-11-06 MED ORDER — MEPERIDINE HCL 50 MG/ML IJ SOLN
INTRAMUSCULAR | Status: DC | PRN
  Administered 2017-11-06 (×2): 25 mg via INTRAVENOUS

## 2017-11-06 MED ORDER — MIDAZOLAM HCL 5 MG/5ML IJ SOLN
INTRAMUSCULAR | Status: DC | PRN
  Administered 2017-11-06: 1 mg via INTRAVENOUS
  Administered 2017-11-06: 2 mg via INTRAVENOUS
  Administered 2017-11-06: 1 mg via INTRAVENOUS

## 2017-11-06 NOTE — Op Note (Signed)
[facility] Patient Name: Christian Johnson Procedure Date: 11/06/2017 8:21 AM MRN: [id number] Date of Birth: [birth year] Attending MD: Hildred Laser , MD CSN: [id number] Age: 77 Admit Type: Outpatient Procedure:                Colonoscopy Indications:              High risk colon cancer surveillance: Personal                            history of colonic polyps Providers:                Hildred Laser, MD, Charlsie Quest. Theda Sers RN, RN, Nelma Rothman, Technician Referring MD:             Dione Housekeeper, MD Medicines:                Meperidine 50 mg IV, Midazolam 4 mg IV Complications:            No immediate complications. Estimated Blood Loss:     Estimated blood loss was minimal. Procedure:                Pre-Anesthesia Assessment:                           - Prior to the procedure, a History and Physical                            was performed, and patient medications and                            allergies were reviewed. The patient's tolerance of                            previous anesthesia was also reviewed. The risks                            and benefits of the procedure and the sedation                            options and risks were discussed with the patient.                            All questions were answered, and informed consent                            was obtained. Prior Anticoagulants: The patient has                            taken no previous anticoagulant or antiplatelet                            agents. ASA Grade Assessment: I - A normal, healthy  patient. After reviewing the risks and benefits,                            the patient was deemed in satisfactory condition to                            undergo the procedure.                           After obtaining informed consent, the colonoscope                            was passed under direct vision. Throughout the                            procedure,  the patient's blood pressure, pulse, and                            oxygen saturations were monitored continuously. The                            EC-[id number] ([id number]) scope was introduced through                            the anus and advanced to the the cecum, identified                            by appendiceal orifice and ileocecal valve. The                            colonoscopy was performed without difficulty. The                            patient tolerated the procedure well. The quality                            of the bowel preparation was adequate. The                            ileocecal valve, appendiceal orifice, and rectum                            were photographed. Scope In: 8:38:30 AM Scope Out: 9:01:18 AM Scope Withdrawal Time: 0 hours 13 minutes 52 seconds  Total Procedure Duration: 0 hours 22 minutes 48 seconds  Findings:      The perianal and digital rectal examinations were normal.      Two sessile polyps were found in the proximal transverse colon and       cecum. The polyps were small in size. These were biopsied with a cold       forceps for histology. The pathology specimen was placed into Bottle       Number 1.      A 8 mm polyp was found in the rectum. The polyp was sessile. The polyp       was  removed with a piecemeal technique using a hot snare. Resection and       retrieval were complete. The pathology specimen was placed into Bottle       Number 2.      Multiple small and large-mouthed diverticula were found in the sigmoid       colon, descending colon and hepatic flexure.      The retroflexed view of the distal rectum and anal verge was normal and       showed no anal or rectal abnormalities. Impression:               - Two small polyps in the proximal transverse colon                            and in the cecum. Biopsied.                           - One 8 mm polyp in the rectum, removed piecemeal                            using a hot snare.  Resected and retrieved.                           - Diverticulosis in the sigmoid colon, in the                            descending colon and at the hepatic flexure.                           - The distal rectum and anal verge are normal on                            retroflexion view. Moderate Sedation:      Moderate (conscious) sedation was administered by the endoscopy nurse       and supervised by the endoscopist. The following parameters were       monitored: oxygen saturation, heart rate, blood pressure, CO2       capnography and response to care. Total physician intraservice time was       28 minutes. Recommendation:           - Patient has a contact number available for                            emergencies. The signs and symptoms of potential                            delayed complications were discussed with the                            patient. Return to normal activities tomorrow.                            Written discharge instructions were provided to the  patient.                           - High fiber diet today.                           - Continue present medications.                           - No aspirin, ibuprofen, naproxen, or other                            non-steroidal anti-inflammatory drugs for 7 days                            after polyp removal.                           - Await pathology results.                           - Repeat colonoscopy in 5 years for surveillance. Procedure Code(s):        --- Professional ---                           [phone], Colonoscopy, flexible; with removal of                            tumor(s), polyp(s), or other lesion(s) by snare                            technique                           45380, 59, Colonoscopy, flexible; with biopsy,                            single or multiple                           G0500, Moderate sedation services provided by the                            same  physician or other qualified health care                            professional performing a gastrointestinal                            endoscopic service that sedation supports,                            requiring the presence of an independent trained                            observer to assist in the monitoring of the  patient's level of consciousness and physiological                            status; initial 15 minutes of intra-service time;                            patient age 54 years or older (additional time may                            be reported with [phone], as appropriate)                           [phone], Moderate sedation services provided by the                            same physician or other qualified health care                            professional performing the diagnostic or                            therapeutic service that the sedation supports,                            requiring the presence of an independent trained                            observer to assist in the monitoring of the                            patient's level of consciousness and physiological                            status; each additional 15 minutes intraservice                            time (List separately in addition to code for                            primary service) Diagnosis Code(s):        --- Professional ---                           Z86.010, Personal history of colonic polyps                           D12.3, Benign neoplasm of transverse colon (hepatic                            flexure or splenic flexure)                           D12.0, Benign neoplasm of cecum  K62.1, Rectal polyp                           K57.30, Diverticulosis of large intestine without                            perforation or abscess without bleeding CPT copyright 2017 American [employer]. All rights reserved. The codes  documented in this report are preliminary and upon coder review may  be revised to meet current compliance requirements. Hildred Laser, MD Hildred Laser, MD 11/06/2017 9:09:37 AM This report has been signed electronically. Number of Addenda: 0

## 2017-11-06 NOTE — Discharge Instructions (Signed)
No aspirin or NSAIDs for 1 week. Resume other medications as before. High-fiber diet. No driving for 24 hours. Physician will call with biopsy results.   Colonoscopy, Adult, Care After This sheet gives you information about how to care for yourself after your procedure. Your doctor may also give you more specific instructions. If you have problems or questions, call your doctor. Follow these instructions at home: General instructions   For the first 24 hours after the procedure: ? Do not drive or use machinery. ? Do not sign important documents. ? Do not drink alcohol. ? Do your daily activities more slowly than normal. ? Eat foods that are soft and easy to digest. ? Rest often.  Take over-the-counter or prescription medicines only as told by your doctor.  It is up to you to get the results of your procedure. Ask your doctor, or the department performing the procedure, when your results will be ready. To help cramping and bloating:  Try walking around.  Put heat on your belly (abdomen) as told by your doctor. Use a heat source that your doctor recommends, such as a moist heat pack or a heating pad. ? Put a towel between your skin and the heat source. ? Leave the heat on for 20-30 minutes. ? Remove the heat if your skin turns bright red. This is especially important if you cannot feel pain, heat, or cold. You can get burned. Eating and drinking  Drink enough fluid to keep your pee (urine) clear or pale yellow.  Return to your normal diet as told by your doctor. Avoid heavy or fried foods that are hard to digest.  Avoid drinking alcohol for as long as told by your doctor. Contact a doctor if:  You have blood in your poop (stool) 2-3 days after the procedure. Get help right away if:  You have more than a small amount of blood in your poop.  You see large clumps of tissue (blood clots) in your poop.  Your belly is swollen.  You feel sick to your stomach  (nauseous).  You throw up (vomit).  You have a fever.  You have belly pain that gets worse, and medicine does not help your pain. This information is not intended to replace advice given to you by your health care provider. Make sure you discuss any questions you have with your health care provider. Document Released: 08/04/2010 Document Revised: 03/26/2016 Document Reviewed: 03/26/2016 Elsevier Interactive Patient Education  2017 [employer]  Diverticulosis Diverticulosis is a condition that develops when small pouches (diverticula) form in the wall of the large intestine (colon). The colon is where water is absorbed and stool is formed. The pouches form when the inside layer of the colon pushes through weak spots in the outer layers of the colon. You may have a few pouches or many of them. What are the causes? The cause of this condition is not known. What increases the risk? The following factors may make you more likely to develop this condition:  Being older than age 75. Your risk for this condition increases with age. Diverticulosis is rare among people younger than age 34. By age 49, many people have it.  Eating a low-fiber diet.  Having frequent constipation.  Being overweight.  Not getting enough exercise.  Smoking.  Taking over-the-counter pain medicines, like aspirin and ibuprofen.  Having a family history of diverticulosis.  What are the signs or symptoms? In most people, there are no symptoms of this condition. If you  do have symptoms, they may include:  Bloating.  Cramps in the abdomen.  Constipation or diarrhea.  Pain in the lower left side of the abdomen.  How is this diagnosed? This condition is most often diagnosed during an exam for other colon problems. Because diverticulosis usually has no symptoms, it often cannot be diagnosed independently. This condition may be diagnosed by:  Using a flexible scope to examine the colon  (colonoscopy).  Taking an X-ray of the colon after dye has been put into the colon (barium enema).  Doing a CT scan.  How is this treated? You may not need treatment for this condition if you have never developed an infection related to diverticulosis. If you have had an infection before, treatment may include:  Eating a high-fiber diet. This may include eating more fruits, vegetables, and grains.  Taking a fiber supplement.  Taking a live bacteria supplement (probiotic).  Taking medicine to relax your colon.  Taking antibiotic medicines.  Follow these instructions at home:  Drink 6-8 glasses of water or more each day to prevent constipation.  Try not to strain when you have a bowel movement.  If you have had an infection before: ? Eat more fiber as directed by your health care provider or your diet and nutrition specialist (dietitian). ? Take a fiber supplement or probiotic, if your health care provider approves.  Take over-the-counter and prescription medicines only as told by your health care provider.  If you were prescribed an antibiotic, take it as told by your health care provider. Do not stop taking the antibiotic even if you start to feel better.  Keep all follow-up visits as told by your health care provider. This is important. Contact a health care provider if:  You have pain in your abdomen.  You have bloating.  You have cramps.  You have not had a bowel movement in 3 days. Get help right away if:  Your pain gets worse.  Your bloating becomes very bad.  You have a fever or chills, and your symptoms suddenly get worse.  You vomit.  You have bowel movements that are bloody or black.  You have bleeding from your rectum. Summary  Diverticulosis is a condition that develops when small pouches (diverticula) form in the wall of the large intestine (colon).  You may have a few pouches or many of them.  This condition is most often diagnosed during an  exam for other colon problems.  If you have had an infection related to diverticulosis, treatment may include increasing the fiber in your diet, taking supplements, or taking medicines. This information is not intended to replace advice given to you by your health care provider. Make sure you discuss any questions you have with your health care provider. Document Released: 03/29/2004 Document Revised: 05/21/2016 Document Reviewed: 05/21/2016 Elsevier Interactive Patient Education  2017 [place].  Colon Polyps Polyps are tissue growths inside the body. Polyps can grow in many places, including the large intestine (colon). A polyp may be a round bump or a mushroom-shaped growth. You could have one polyp or several. Most colon polyps are noncancerous (benign). However, some colon polyps can become cancerous over time. What are the causes? The exact cause of colon polyps is not known. What increases the risk? This condition is more likely to develop in people who:  Have a family history of colon cancer or colon polyps.  Are older than 78 or older than 45 if they are African American.  Have inflammatory  bowel disease, such as ulcerative colitis or Crohn disease.  Are overweight.  Smoke cigarettes.  Do not get enough exercise.  Drink too much alcohol.  Eat a diet that is: ? High in fat and red meat. ? Low in fiber.  Had childhood cancer that was treated with abdominal radiation.  What are the signs or symptoms? Most polyps do not cause symptoms. If you have symptoms, they may include:  Blood coming from your rectum when having a bowel movement.  Blood in your stool.The stool may look dark red or black.  A change in bowel habits, such as constipation or diarrhea.  How is this diagnosed? This condition is diagnosed with a colonoscopy. This is a procedure that uses a lighted, flexible scope to look at the inside of your colon. How is this treated? Treatment for this  condition involves removing any polyps that are found. Those polyps will then be tested for cancer. If cancer is found, your health care provider will talk to you about options for colon cancer treatment. Follow these instructions at home: Diet  Eat plenty of fiber, such as fruits, vegetables, and whole grains.  Eat foods that are high in calcium and vitamin D, such as milk, cheese, yogurt, eggs, liver, fish, and broccoli.  Limit foods high in fat, red meats, and processed meats, such as hot dogs, sausage, bacon, and lunch meats.  Maintain a healthy weight, or lose weight if recommended by your health care provider. General instructions  Do not smoke cigarettes.  Do not drink alcohol excessively.  Keep all follow-up visits as told by your health care provider. This is important. This includes keeping regularly scheduled colonoscopies. Talk to your health care provider about when you need a colonoscopy.  Exercise every day or as told by your health care provider. Contact a health care provider if:  You have new or worsening bleeding during a bowel movement.  You have new or increased blood in your stool.  You have a change in bowel habits.  You unexpectedly lose weight. This information is not intended to replace advice given to you by your health care provider. Make sure you discuss any questions you have with your health care provider. Document Released: 03/28/2004 Document Revised: 12/08/2015 Document Reviewed: 05/23/2015 Elsevier Interactive Patient Education  [employer].

## 2017-11-06 NOTE — H&P (Signed)
Christian Johnson is an 77 y.o. male.   Chief Complaint: Patient is here for colonoscopy. HPI: Patient is 77 year old Caucasian male who has a history of colonic adenomas and is here for surveillance colonoscopy.  He denies abdominal pain change in bowel habits or rectal bleeding.  His last colonoscopy was in July 2013 with removal of multiple polyps.  Most of these are tubular adenomas but one was tubulovillous adenoma. Family history is negative for CRC.  Past Medical History:  Diagnosis Date  . Cancer [facility])    Prostate  . Colon polyps   . GERD (gastroesophageal reflux disease)   . Hypothyroidism   . Prostate cancer [facility])     Past Surgical History:  Procedure Laterality Date  . COLONOSCOPY  01/31/2012   Procedure: COLONOSCOPY;  Surgeon: Rogene Houston, MD;  Location: [facility];  Service: Endoscopy;  Laterality: N/A;  930  . Colonoscopy with polyp removal    . Right hip core decompression      Family History  Problem Relation Age of Onset  . Breast cancer Mother   . Colon cancer Neg Hx    Social History:  reports that he has quit smoking. His smoking use included cigarettes. He has a 25.00 pack-year smoking history. He has never used smokeless tobacco. He reports that he does not drink alcohol or use drugs.  Allergies: No Known Allergies  Medications Prior to Admission  Medication Sig Dispense Refill  . Apoaequorin (PREVAGEN) 10 MG CAPS Take 10 mg by mouth daily.    Marland Kitchen levothyroxine (SYNTHROID, LEVOTHROID) 88 MCG tablet Take 88 mcg by mouth daily before breakfast.     . Multiple Vitamin (MULTIVITAMIN WITH MINERALS) TABS Take 1 tablet by mouth daily.    . Omega-3 Fatty Acids (FISH OIL) 1200 MG CAPS Take 1,200 mg by mouth daily.     Marland Kitchen omeprazole (PRILOSEC) 20 MG capsule Take 20 mg by mouth every morning.     . naproxen sodium (ALEVE) 220 MG tablet Take 220 mg by mouth daily as needed (for pain or headache).       No results found for this or any previous visit (from the past  48 hour(s)). No results found.  ROS  Blood pressure (!) 178/75, pulse 65, temperature 97.9 F (36.6 C), temperature source Oral, resp. rate 12, height 6' (1.829 m), weight 175 lb (79.4 kg), SpO2 100 %. Physical Exam  Constitutional: He appears well-developed and well-nourished.  HENT:  Mouth/Throat: Oropharynx is clear and moist.  Eyes: Conjunctivae are normal. No scleral icterus.  Neck: No thyromegaly present.  Cardiovascular: Normal rate, regular rhythm and normal heart sounds.  No murmur heard. Respiratory: Effort normal and breath sounds normal.  GI: Soft. He exhibits no distension and no mass. There is no tenderness.  Musculoskeletal: He exhibits no edema.  Lymphadenopathy:    He has no cervical adenopathy.  Neurological: He is alert.  Skin: Skin is warm and dry.     Assessment/Plan History of colonic adenomas. Surveillance colonoscopy.  Hildred Laser, MD 11/06/2017, 8:29 AM

## 2017-11-11 ENCOUNTER — Encounter (HOSPITAL_COMMUNITY): Payer: Self-pay | Admitting: Internal Medicine

## 2017-11-14 ENCOUNTER — Ambulatory Visit (INDEPENDENT_AMBULATORY_CARE_PROVIDER_SITE_OTHER): Payer: Medicare Other | Admitting: Diagnostic Neuroimaging

## 2017-11-14 DIAGNOSIS — R29898 Other symptoms and signs involving the musculoskeletal system: Secondary | ICD-10-CM

## 2017-11-14 DIAGNOSIS — Z0289 Encounter for other administrative examinations: Secondary | ICD-10-CM

## 2017-11-14 DIAGNOSIS — R200 Anesthesia of skin: Secondary | ICD-10-CM

## 2017-11-18 NOTE — Procedures (Signed)
[facility]  NCS (NERVE CONDUCTION STUDY) WITH EMG (ELECTROMYOGRAPHY) REPORT   STUDY DATE: 11/14/17 PATIENT NAME: Christian Johnson DOB: Dec 07, 1940 MRN: [id number]  ORDERING CLINICIAN: Cordella Register, MD  TECHNOLOGIST: Oneita Jolly ELECTROMYOGRAPHER: Earlean Polka. Khambrel Amsden, MD  CLINICAL INFORMATION: 77 year old male with right arm pain, numbness and weakness x 1 month. S/p fall 2 months ago.  Neurologic examination notable for weakness in right hand finger flexion (digits 4 and 5), finger abduction, finger extension.   FINDINGS: NERVE CONDUCTION STUDY:  Right median motor response is prolonged distal latency, decreased amplitude, normal conduction velocity.  Right ulnar motor response has prolonged distal latency, decreased amplitude and normal conduction velocity.  Right radial motor response is normal.  Left median and left ulnar motor responses are normal.  Bilateral median sensory responses have prolonged peak latencies and decreased amplitudes.  Bilateral ulnar sensory responses are normal.  Right medial antebrachial cutaneous nerve of forearm could not be obtained.  Left medial antebrachial cutaneous nerve of forearm has normal peak latency and slightly decreased amplitude.     Right ulnar F-wave latency is prolonged.  Left ulnar F wave latency is normal.   NEEDLE ELECTROMYOGRAPHY:  Needle examination of right upper extremity is notable for positive sharp waves and fibrillations and right first dorsal interosseous and right flexor carpi ulnaris, with decreased recruitment of large motor units on exertion.  Right extensor indicis proprius has no abnormal spontaneous activity at rest and decreased recruitment of large motor unit on exertion.  Other muscles of right upper extremity and cervical paraspinal muscles are unremarkable.  See EMG table for additional details.    IMPRESSION:   Abnormal study demonstrating: - Electrodiagnostic evidence of  right lower brachial plexopathy (possibly lower trunk). A right C8-T1 radiculopathy was considered given the normal ulnar sensory response and abnormal ulnar motor response, but less likely given normal right cervical paraspinal muscles and absent response of right medial antebrachial cutaneous nerve of forearm.  - Superimposed right median neuropathy at the wrist consistent with right carpal tunnel syndrome.     INTERPRETING PHYSICIAN:  Penni Bombard, MD Certified in Neurology, Neurophysiology and Neuroimaging  [facility] [address] [place], [place] [address] [phone]   [facility]    Nerve / Sites Muscle Latency Ref. Amplitude Ref. Rel Amp Segments Distance Velocity Ref. Area    ms ms mV mV %  cm m/s m/s mVms  R Median - APB     Wrist APB 4.5 ?4.4 2.5 ?4.0 100 Wrist - APB 7   7.7     Upper arm APB 9.3  2.4  94.9 Upper arm - Wrist 24 50 ?49 7.6  L Median - APB     Wrist APB 4.3 ?4.4 4.1 ?4.0 100 Wrist - APB 7   15.7     Upper arm APB 9.3  4.1  99.3 Upper arm - Wrist 24 49 ?49 15.8  R Ulnar - ADM     Wrist ADM 3.4 ?3.3 3.7 ?6.0 100 Wrist - ADM 7   10.2     B.Elbow ADM 8.0  3.4  91.2 B.Elbow - Wrist 22 48 ?49 10.1     A.Elbow ADM 10.1  3.1  91.4 A.Elbow - B.Elbow 10 48 ?49 9.9         A.Elbow - Wrist      L Ulnar - ADM     Wrist ADM 3.2 ?3.3 9.0 ?6.0 100 Wrist - ADM 7   31.1  B.Elbow ADM 7.7  8.6  95.2 B.Elbow - Wrist 23 51 ?49 29.8     A.Elbow ADM 9.6  8.1  95.3 A.Elbow - B.Elbow 10 53 ?49 29.3         A.Elbow - Wrist      R Radial - EIP     Forearm EIP 2.8 ?2.9 3.3 ?2.0 100 Forearm - EIP 4  ?49 18.0     Elbow EIP 6.1  3.9  117 Elbow - Forearm 17 51  18.9     Spiral Gr EIP 8.5  4.0  104 Spiral Gr - Elbow 12 50  19.4               SNC    Nerve / Sites Rec. Site Peak Lat Ref.  Amp Ref. Segments Distance    ms ms V V  cm  R Median - Orthodromic (Dig II, Mid palm)     Dig II Wrist 4.1 ?3.4 4 ?10 Dig II - Wrist 13  L Median - Orthodromic  (Dig II, Mid palm)     Dig II Wrist 3.6 ?3.4 6 ?10 Dig II - Wrist 13  R Ulnar - Orthodromic, (Dig V, Mid palm)     Dig V Wrist 3.0 ?3.1 5 ?5 Dig V - Wrist 11  L Ulnar - Orthodromic, (Dig V, Mid palm)     Dig V Wrist 2.9 ?3.1 5 ?5 Dig V - Wrist 11  R Medial antebrachial cutaneous - Forearm (Elbow)     Elbow Forearm NR ?3.2 NR ?5 Elbow - Forearm 12  L Medial antebrachial cutaneous - Forearm (Elbow)     Elbow Forearm 3.2 ?3.2 4 ?5 Elbow - Forearm 12                 SNC    Nerve / Sites Rec. Site Peak Lat Amp Segments Distance    ms V  cm  R Radial - Anatomical snuff box (Forearm)     Forearm Wrist 2.7 9 Forearm - Wrist 10        F  Wave    Nerve F Lat Ref.   ms ms  R Ulnar - ADM 36.7 ?32.0  L Ulnar - ADM 32.4 ?32.0         EMG full       EMG Summary Table    Spontaneous MUAP Recruitment  Muscle IA Fib PSW Fasc Other Amp Dur. Poly Pattern  R. Deltoid Normal None None None _______ Normal Normal Normal Normal  R. Biceps brachii Normal None None None _______ Normal Normal Normal Normal  R. Triceps brachii Normal None None None _______ Normal Normal Normal Normal  R. Flexor carpi radialis Normal None None None _______ Normal Normal Normal Normal  R. First dorsal interosseous Normal 2+ 2+ None _______ Increased Normal Normal Discrete  R. Flexor carpi ulnaris Normal 1+ 1+ None _______ Increased Normal 1+ Reduced  R. Cervical paraspinals Normal None None None _______ Normal Normal Normal Normal  R. Supraspinatus Normal None None None _______ Normal Normal Normal Normal  R. Infraspinatus Normal None None None _______ Normal Normal Normal Normal  R. Rhomboid major Normal None None None _______ Normal Normal Normal Normal  R. Brachioradialis Normal None None None _______ Normal Normal Normal Normal  R. Extensor indicis proprius Normal None None None _______ Increased Normal Normal Discrete

## 2017-11-25 ENCOUNTER — Telehealth (INDEPENDENT_AMBULATORY_CARE_PROVIDER_SITE_OTHER): Payer: Self-pay | Admitting: Orthopaedic Surgery

## 2017-11-25 NOTE — Telephone Encounter (Signed)
Please advise 

## 2017-11-25 NOTE — Telephone Encounter (Signed)
Patient's wife called stating they went to [facility] and had nerve conduction study on 11/14/17 and are requesting a return call with the results.

## 2017-11-26 NOTE — Telephone Encounter (Signed)
Called with results-better over the past two weeks

## 2018-07-08 HISTORY — PX: EYE SURGERY: SHX253

## 2018-12-17 DIAGNOSIS — K219 Gastro-esophageal reflux disease without esophagitis: Secondary | ICD-10-CM | POA: Diagnosis not present

## 2018-12-17 DIAGNOSIS — E039 Hypothyroidism, unspecified: Secondary | ICD-10-CM | POA: Diagnosis not present

## 2018-12-18 DIAGNOSIS — K219 Gastro-esophageal reflux disease without esophagitis: Secondary | ICD-10-CM | POA: Diagnosis not present

## 2018-12-18 DIAGNOSIS — E039 Hypothyroidism, unspecified: Secondary | ICD-10-CM | POA: Diagnosis not present

## 2019-01-08 ENCOUNTER — Ambulatory Visit: Payer: Medicare Other | Admitting: Family Medicine

## 2019-01-08 DIAGNOSIS — H25813 Combined forms of age-related cataract, bilateral: Secondary | ICD-10-CM | POA: Diagnosis not present

## 2019-01-08 DIAGNOSIS — H35372 Puckering of macula, left eye: Secondary | ICD-10-CM | POA: Diagnosis not present

## 2019-01-09 ENCOUNTER — Other Ambulatory Visit: Payer: Self-pay

## 2019-01-12 ENCOUNTER — Other Ambulatory Visit: Payer: Self-pay

## 2019-01-12 ENCOUNTER — Ambulatory Visit (INDEPENDENT_AMBULATORY_CARE_PROVIDER_SITE_OTHER): Payer: Medicare Other | Admitting: Family Medicine

## 2019-01-12 ENCOUNTER — Encounter: Payer: Self-pay | Admitting: Family Medicine

## 2019-01-12 DIAGNOSIS — E039 Hypothyroidism, unspecified: Secondary | ICD-10-CM

## 2019-01-12 DIAGNOSIS — K219 Gastro-esophageal reflux disease without esophagitis: Secondary | ICD-10-CM | POA: Diagnosis not present

## 2019-01-12 DIAGNOSIS — Z8546 Personal history of malignant neoplasm of prostate: Secondary | ICD-10-CM | POA: Diagnosis not present

## 2019-01-12 MED ORDER — LEVOTHYROXINE SODIUM 88 MCG PO TABS: 88.0000 ug | ORAL_TABLET | Freq: Every day | ORAL | 3 refills | Status: DC

## 2019-01-12 MED ORDER — OMEPRAZOLE 20 MG PO CPDR
20.0000 mg | DELAYED_RELEASE_CAPSULE | ORAL | 3 refills | Status: DC
Start: 2019-01-12 — End: 2020-01-15

## 2019-01-12 NOTE — Progress Notes (Signed)
BP 138/69   Pulse 75   Temp (!) 97.5 F (36.4 C) (Oral)   Ht 6\' 1"  (1.854 m)   Wt 172 lb 12.8 oz (78.4 kg)   BMI 22.80 kg/m    Subjective:    Patient ID: Christian Johnson, male    DOB: Jun 06, 1941, 78 y.o.   MRN: [id number]  HPI: Christian Johnson is a 78 y.o. male presenting on 01/12/2019 for New Patient (Initial Visit) ([place]), Establish Care, and Dizziness (Patient states he gets dizzy if he bends over or gets up tpp fast.)   HPI Hypothyroidism recheck Patient is coming in for thyroid recheck today as well. They deny any issues with hair changes or heat or cold problems or diarrhea or constipation. They deny any chest pain or palpitations. They are currently on levothyroxine 2micrograms   GERD Patient is currently on omeprazole 20.  She denies any major symptoms or abdominal pain or belching or burping. She denies any blood in her stool or lightheadedness.   Patient was having some dizziness that happens very intermittently but more happens when he is bending forward and goes up.  He does note that it happens more on hot days.  He does admit that he is not a good water drinker.  Patient has a history of prostate cancer in 2013 treated with radiation and has been following the PSAs and his last PSA was 0.7 and has been consistently around that level.  Relevant past medical, surgical, family and social history reviewed and updated as indicated. Interim medical history since our last visit reviewed. Allergies and medications reviewed and updated.  Review of Systems  Constitutional: Negative for chills and fever.  HENT: Negative for congestion, ear pain and tinnitus.   Eyes: Negative for pain and discharge.  Respiratory: Negative for cough, shortness of breath and wheezing.   Cardiovascular: Negative for chest pain, palpitations and leg swelling.  Gastrointestinal: Negative for abdominal pain, blood in stool, constipation and diarrhea.  Genitourinary: Negative for dysuria and  hematuria.  Musculoskeletal: Negative for back pain, gait problem and myalgias.  Skin: Negative for rash.  Neurological: Positive for dizziness. Negative for weakness and headaches.  Psychiatric/Behavioral: Negative for suicidal ideas.  All other systems reviewed and are negative.   Per HPI unless specifically indicated above  Social History   Socioeconomic History  . Marital status: Married    Spouse name: Not on file  . Number of children: Not on file  . Years of education: Not on file  . Highest education level: Not on file  Occupational History  . Not on file  Social Needs  . Financial resource strain: Not on file  . Food insecurity    Worry: Not on file    Inability: Not on file  . Transportation needs    Medical: Not on file    Non-medical: Not on file  Tobacco Use  . Smoking status: Former Smoker    Packs/day: 1.00    Years: 25.00    Pack years: 25.00    Types: Cigarettes  . Smokeless tobacco: Never Used  Substance and Sexual Activity  . Alcohol use: No  . Drug use: No  . Sexual activity: Not on file  Lifestyle  . Physical activity    Days per week: Not on file    Minutes per session: Not on file  . Stress: Not on file  Relationships  . Social Herbalist on phone: Not on file    Gets  together: Not on file    Attends religious service: Not on file    Active member of club or organization: Not on file    Attends meetings of clubs or organizations: Not on file    Relationship status: Not on file  . Intimate partner violence    Fear of current or ex partner: Not on file    Emotionally abused: Not on file    Physically abused: Not on file    Forced sexual activity: Not on file  Other Topics Concern  . Not on file  Social History Narrative  . Not on file    Past Surgical History:  Procedure Laterality Date  . COLONOSCOPY  01/31/2012   Procedure: COLONOSCOPY;  Surgeon: Rogene Houston, MD;  [facility];  Service: Endoscopy;   Laterality: N/A;  930  . COLONOSCOPY N/A 11/06/2017   Procedure: COLONOSCOPY;  Surgeon: Rogene Houston, MD;  [facility];  Service: Endoscopy;  Laterality: N/A;  830  . Colonoscopy with polyp removal    . POLYPECTOMY  11/06/2017   Procedure: POLYPECTOMY;  Surgeon: Rogene Houston, MD;  [facility];  Service: Endoscopy;;  cecal (CB), transverse colon (CB), rectal polyp (HS)  . Right hip core decompression      Family History  Problem Relation Age of Onset  . Breast cancer Mother   . Colon cancer Neg Hx     Allergies as of 01/12/2019   No Known Allergies     Medication List       Accurate as of January 12, 2019 10:22 AM. If you have any questions, ask your nurse or doctor.        Aleve 220 MG tablet Generic drug: naproxen sodium Take 220 mg by mouth daily as needed (for pain or headache).   Fish Oil 1200 MG Caps Take 1,200 mg by mouth daily.   levothyroxine 88 MCG tablet Commonly known as: SYNTHROID Take 1 tablet (88 mcg total) by mouth daily before breakfast.   multivitamin with minerals Tabs tablet Take 1 tablet by mouth daily.   omeprazole 20 MG capsule Commonly known as: PRILOSEC Take 1 capsule (20 mg total) by mouth every morning.   Prevagen 10 MG Caps Generic drug: Apoaequorin Take 10 mg by mouth daily.          Objective:    BP 138/69   Pulse 75   Temp (!) 97.5 F (36.4 C) (Oral)   Ht 6\' 1"  (1.854 m)   Wt 172 lb 12.8 oz (78.4 kg)   BMI 22.80 kg/m   Wt Readings from Last 3 Encounters:  01/12/19 172 lb 12.8 oz (78.4 kg)  11/06/17 175 lb (79.4 kg)  09/18/17 180 lb (81.6 kg)    Physical Exam Vitals signs and nursing note reviewed.  Constitutional:      General: He is not in acute distress.    Appearance: He is well-developed. He is not diaphoretic.  Eyes:     General: No scleral icterus.    Conjunctiva/sclera: Conjunctivae normal.  Neck:     Musculoskeletal: Neck supple.     Thyroid: No thyromegaly.  Cardiovascular:      Rate and Rhythm: Normal rate and regular rhythm.     Heart sounds: Normal heart sounds. No murmur.  Pulmonary:     Effort: Pulmonary effort is normal. No respiratory distress.     Breath sounds: Normal breath sounds. No wheezing.  Abdominal:     General: Abdomen is  flat. There is no distension.     Tenderness: There is no abdominal tenderness.  Musculoskeletal: Normal range of motion.        General: No swelling.  Lymphadenopathy:     Cervical: No cervical adenopathy.  Skin:    General: Skin is warm and dry.     Findings: No rash.  Neurological:     Mental Status: He is alert and oriented to person, place, and time.     Coordination: Coordination normal.  Psychiatric:        Behavior: Behavior normal.     No results found for this or any previous visit.    Assessment & Plan:   Problem List Items Addressed This Visit      Digestive   GERD (gastroesophageal reflux disease)   Relevant Medications   omeprazole (PRILOSEC) 20 MG capsule     Endocrine   Hypothyroidism   Relevant Medications   levothyroxine (SYNTHROID) 88 MCG tablet     Other   History of prostate cancer      Patient new patient to [place], just had blood work done earlier this month with his previous physician Dr. Edrick Oh, will continue the medication, the blood work looks great except for the cholesterol was borderline at an LDL of just over 100.  Return in 6 months Follow up plan: Return in about 6 months (around 07/14/2019), or if symptoms worsen or fail to improve, for recheck thyroid and psa.  Caryl Pina, MD [facility] 01/12/2019, 10:22 AM

## 2019-02-09 ENCOUNTER — Ambulatory Visit: Payer: Medicare Other

## 2019-02-24 ENCOUNTER — Ambulatory Visit (INDEPENDENT_AMBULATORY_CARE_PROVIDER_SITE_OTHER): Payer: Medicare Other | Admitting: *Deleted

## 2019-02-24 ENCOUNTER — Encounter: Payer: Self-pay | Admitting: *Deleted

## 2019-02-24 DIAGNOSIS — Z0000 Encounter for general adult medical examination without abnormal findings: Secondary | ICD-10-CM

## 2019-02-24 NOTE — Progress Notes (Addendum)
MEDICARE ANNUAL WELLNESS VISIT  02/24/2019  Telephone Visit Disclaimer This Medicare AWV was conducted by telephone due to national recommendations for restrictions regarding the COVID-19 Pandemic (e.g. social distancing).  I verified, using two identifiers, that I am speaking with Christian Johnson or their authorized healthcare agent. I discussed the limitations, risks, security, and privacy concerns of performing an evaluation and management service by telephone and the potential availability of an in-person appointment in the future. The patient expressed understanding and agreed to proceed.   Subjective:  Christian Johnson is a 78 y.o. male patient of Dettinger, Fransisca Kaufmann, MD who had a Medicare Annual Wellness Visit today via telephone. Christian Johnson is Working part time at the [employer] and lives with their spouse Vaughan Basta. he has 2 children. he reports that he is socially active and does interact with friends/family regularly. he is minimally physically active and enjoys woodworking.  Patient Care Team: Dettinger, Fransisca Kaufmann, MD as PCP - General (Family Medicine)  Advanced Directives 02/24/2019 11/06/2017 01/31/2012  Does Patient Have a Medical Advance Directive? No Yes Patient has advance directive, copy not in chart  Type of Advance Directive - [place];Living will Living will  Copy of [place] in Chart? - No - copy requested -  Would patient like information on creating a medical advance directive? No - Patient declined - -  Pre-existing out of facility DNR order (yellow form or pink MOST form) - - No    Hospital Utilization Over the Past 12 Months: # of hospitalizations or ER visits: 0 # of surgeries: 0  Review of Systems    Patient reports that his overall health is unchanged compared to last year.  Patient Reported Readings (BP, Pulse, CBG, Weight, etc) none  Review of Systems: has concerns about his memory not being as good as it used to be. Appt  scheduled with Dr Warrick Parisian 04/10/19 to discuss this.  All other systems negative.  Pain Assessment Pain : No/denies pain     Current Medications & Allergies (verified) Allergies as of 02/24/2019   No Known Allergies     Medication List       Accurate as of February 24, 2019 11:16 AM. If you have any questions, ask your nurse or doctor.        Aleve 220 MG tablet Generic drug: naproxen sodium Take 220 mg by mouth daily as needed (for pain or headache).   Fish Oil 1200 MG Caps Take 1,200 mg by mouth daily.   levothyroxine 88 MCG tablet Commonly known as: SYNTHROID Take 1 tablet (88 mcg total) by mouth daily before breakfast.   multivitamin with minerals Tabs tablet Take 1 tablet by mouth daily.   omeprazole 20 MG capsule Commonly known as: PRILOSEC Take 1 capsule (20 mg total) by mouth every morning.   Prevagen 10 MG Caps Generic drug: Apoaequorin Take 10 mg by mouth daily.       History (reviewed): Past Medical History:  Diagnosis Date  . Cancer [facility])    Prostate  . Colon polyps   . GERD (gastroesophageal reflux disease)   . Hypothyroidism   . Prostate cancer [facility])    Past Surgical History:  Procedure Laterality Date  . COLONOSCOPY  01/31/2012   Procedure: COLONOSCOPY;  Surgeon: Rogene Houston, MD;  [facility];  Service: Endoscopy;  Laterality: N/A;  930  . COLONOSCOPY N/A 11/06/2017   Procedure: COLONOSCOPY;  Surgeon: Rogene Houston, MD;  [facility];  Service: Endoscopy;  Laterality: N/A;  830  . Colonoscopy with polyp removal    . POLYPECTOMY  11/06/2017   Procedure: POLYPECTOMY;  Surgeon: Rogene Houston, MD;  [facility];  Service: Endoscopy;;  cecal (CB), transverse colon (CB), rectal polyp (HS)  . Right hip core decompression     Family History  Problem Relation Age of Onset  . Breast cancer Mother   . Colon cancer Neg Hx    Social History   Socioeconomic History  . Marital status: Married    Spouse  name: Vaughan Basta  . Number of children: 2  . Years of education: 42  . Highest education level: High school graduate  Occupational History  . 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 Financial resource strain: Not hard at all  . Food insecurity    Worry: Never true    Inability: Never true  . Transportation needs    Medical: No    Non-medical: No  Tobacco Use  . Smoking status: Former Smoker    Packs/day: 1.00    Years: 25.00    Pack years: 25.00    Types: Cigarettes    Quit date: 02/24/1999    Years since quitting: 20.0  . Smokeless tobacco: Never Used  Substance and Sexual Activity  . Alcohol use: No  . Drug use: No  . Sexual activity: Not Currently  Lifestyle  . Physical activity    Days per week: 0 days    Minutes per session: 0 min  . Stress: Not at all  Relationships  . Social connections    Talks on phone: More than three times a week    Gets together: More than three times a week    Attends religious service: More than 4 times per year    Active member of club or organization: Yes    Attends meetings of clubs or organizations: More than 4 times per year    Relationship status: Married  Other Topics Concern  . Not on file  Social History Narrative  . Not on file    Activities of Daily Living In your present state of health, do you have any difficulty performing the following activities: 02/24/2019  Hearing? Y  Comment wears bilateral hearing aids  Vision? Y  Comment has surgery scheduled for 03/12/19 to have cataract removed from left eye  Difficulty concentrating or making decisions? Y  Comment memory isn't as good as it used to be  Walking or climbing stairs? N  Dressing or bathing? N  Doing errands, shopping? N  Preparing Food and eating ? N  Using the Toilet? N  In the past six months, have you accidently leaked urine? N  Do you have problems with loss of bowel control? N  Managing your Medications? N  Managing your Finances? N   Housekeeping or managing your Housekeeping? N  Some recent data might be hidden    Patient Education/ Literacy How often do you need to have someone help you when you read instructions, pamphlets, or other written materials from your doctor or pharmacy?: 2 - Rarely What is the last grade level you completed in school?: 12th grade  Exercise Current Exercise Habits: The patient does not participate in regular exercise at present, Exercise limited by: None identified  Diet Patient reports consuming 3 meals a day and 1 snack(s) a day Patient reports that his primary diet is: Regular Patient reports that  she does have regular access to food.   Depression Screen PHQ 2/9 Scores 02/24/2019 01/12/2019  PHQ - 2 Score 1 0     Fall Risk Fall Risk  02/24/2019 01/12/2019  Falls in the past year? 0 0     Objective:  Christian Johnson seemed alert and oriented and he participated appropriately during our telephone visit.  Blood Pressure Weight BMI  BP Readings from Last 3 Encounters:  01/12/19 138/69  11/06/17 (!) 106/45  09/18/17 (!) 175/95   Wt Readings from Last 3 Encounters:  01/12/19 172 lb 12.8 oz (78.4 kg)  11/06/17 175 lb (79.4 kg)  09/18/17 180 lb (81.6 kg)   BMI Readings from Last 1 Encounters:  01/12/19 22.80 kg/m    *Unable to obtain current vital signs, weight, and BMI due to telephone visit type  Hearing/Vision  . Christian Johnson did not seem to have difficulty with hearing/understanding during the telephone conversation . Reports that he has had a formal eye exam by an eye care professional within the past year . Reports that he has not had a formal hearing evaluation within the past year *Unable to fully assess hearing and vision during telephone visit type  Cognitive Function: 6CIT Screen 02/24/2019  What Year? 0 points  What month? 0 points  What time? 0 points  Count back from 20 0 points  Months in reverse 4 points  Repeat phrase 4 points  Total Score 8   (Normal:0-7,  Significant for Dysfunction: >8)  Normal Cognitive Function Screening: No: pt scored 8 and he did voice some concerns about his memory recently. I scheduled a follow up appt with Dr Dettinger to evaluate this and repeat the MMSE.   Immunization & Health Maintenance Record Immunization History  Administered Date(s) Administered  . Influenza, High Dose Seasonal PF 03/17/2015, 04/18/2016, 04/23/2017, 04/15/2018  . Pneumococcal Conjugate-13 07/27/2015  . Pneumococcal Polysaccharide-23 12/19/2016  . Tdap 01/01/2011    Health Maintenance  Topic Date Due  . INFLUENZA VACCINE  02/14/2019  . TETANUS/TDAP  12/31/2020  . COLONOSCOPY  11/07/2022  . PNA vac Low Risk Adult  Completed       Assessment  This is a routine wellness examination for Christian Johnson.  Health Maintenance: Due or Overdue Health Maintenance Due  Topic Date Due  . INFLUENZA VACCINE  02/14/2019    Christian Johnson does not need a referral for Community Assistance: Care Management:   no Social Work:    no Prescription Assistance:  no Nutrition/Diabetes Education:  no   Plan:  Personalized Goals Goals Addressed            This Visit's Progress   . DIET - INCREASE WATER INTAKE       Try to drink 6-8 glasses of water daily.      Personalized Health Maintenance & Screening Recommendations  Influenza vaccine Shingles vaccine  Lung Cancer Screening Recommended: no (Low Dose CT Chest recommended if Age 1-80 years, 30 pack-year currently smoking OR have quit w/in past 15 years) Hepatitis C Screening recommended: no HIV Screening recommended: no  Advanced Directives: Written information was not prepared per patient's request.  Referrals & Orders No orders of the defined types were placed in this encounter.   Follow-up Plan . Follow-up with Dettinger, Fransisca Kaufmann, MD as planned . Consider Shingles and Flu vaccines at your next visit with your PCP   I have personally reviewed and noted the following in the  patient's chart:   . Medical and  social history . Use of alcohol, tobacco or illicit drugs  . Current medications and supplements . Functional ability and status . Nutritional status . Physical activity . Advanced directives . List of other physicians . Hospitalizations, surgeries, and ER visits in previous 12 months . Vitals . Screenings to include cognitive, depression, and falls . Referrals and appointments  In addition, I have reviewed and discussed with Christian Johnson certain preventive protocols, quality metrics, and best practice recommendations. A written personalized care plan for preventive services as well as general preventive health recommendations is available and can be mailed to the patient at his request.      Marylin Crosby, LPN  [id number]/[id number]   I have reviewed and agree with the above AWV documentation.   Evelina Dun, FNP

## 2019-02-24 NOTE — Patient Instructions (Signed)
Preventive Care 75 Years and Older, Male Preventive care refers to lifestyle choices and visits with your health care provider that can promote health and wellness. This includes:  A yearly physical exam. This is also called an annual well check.  Regular dental and eye exams.  Immunizations.  Screening for certain conditions.  Healthy lifestyle choices, such as diet and exercise. What can I expect for my preventive care visit? Physical exam Your health care provider will check:  Height and weight. These may be used to calculate body mass index (BMI), which is a measurement that tells if you are at a healthy weight.  Heart rate and blood pressure.  Your skin for abnormal spots. Counseling Your health care provider may ask you questions about:  Alcohol, tobacco, and drug use.  Emotional well-being.  Home and relationship well-being.  Sexual activity.  Eating habits.  History of falls.  Memory and ability to understand (cognition).  Work and work Statistician. What immunizations do I need?  Influenza (flu) vaccine  This is recommended every year. Tetanus, diphtheria, and pertussis (Tdap) vaccine  You may need a Td booster every 10 years. Varicella (chickenpox) vaccine  You may need this vaccine if you have not already been vaccinated. Zoster (shingles) vaccine  You may need this after age 50. Pneumococcal conjugate (PCV13) vaccine  One dose is recommended after age 24. Pneumococcal polysaccharide (PPSV23) vaccine  One dose is recommended after age 33. Measles, mumps, and rubella (MMR) vaccine  You may need at least one dose of MMR if you were born in 1957 or later. You may also need a second dose. Meningococcal conjugate (MenACWY) vaccine  You may need this if you have certain conditions. Hepatitis A vaccine  You may need this if you have certain conditions or if you travel or work in places where you may be exposed to hepatitis A. Hepatitis B vaccine   You may need this if you have certain conditions or if you travel or work in places where you may be exposed to hepatitis B. Haemophilus influenzae type b (Hib) vaccine  You may need this if you have certain conditions. You may receive vaccines as individual doses or as more than one vaccine together in one shot (combination vaccines). Talk with your health care provider about the risks and benefits of combination vaccines. What tests do I need? Blood tests  Lipid and cholesterol levels. These may be checked every 5 years, or more frequently depending on your overall health.  Hepatitis C test.  Hepatitis B test. Screening  Lung cancer screening. You may have this screening every year starting at age 74 if you have a 30-pack-year history of smoking and currently smoke or have quit within the past 15 years.  Colorectal cancer screening. All adults should have this screening starting at age 57 and continuing until age 54. Your health care provider may recommend screening at age 47 if you are at increased risk. You will have tests every 1-10 years, depending on your results and the type of screening test.  Prostate cancer screening. Recommendations will vary depending on your family history and other risks.  Diabetes screening. This is done by checking your blood sugar (glucose) after you have not eaten for a while (fasting). You may have this done every 1-3 years.  Abdominal aortic aneurysm (AAA) screening. You may need this if you are a current or former smoker.  Sexually transmitted disease (STD) testing. Follow these instructions at home: Eating and drinking  Eat  a diet that includes fresh fruits and vegetables, whole grains, lean protein, and low-fat dairy products. Limit your intake of foods with high amounts of sugar, saturated fats, and salt.  Take vitamin and mineral supplements as recommended by your health care provider.  Do not drink alcohol if your health care provider  tells you not to drink.  If you drink alcohol: ? Limit how much you have to 0-2 drinks a day. ? Be aware of how much alcohol is in your drink. In the [place] one drink equals one 12 oz bottle of beer (355 mL), one 5 oz glass of wine (148 mL), or one 1 oz glass of hard liquor (44 mL). Lifestyle  Take daily care of your teeth and gums.  Stay active. Exercise for at least 30 minutes on 5 or more days each week.  Do not use any products that contain nicotine or tobacco, such as cigarettes, e-cigarettes, and chewing tobacco. If you need help quitting, ask your health care provider.  If you are sexually active, practice safe sex. Use a condom or other form of protection to prevent STIs (sexually transmitted infections).  Talk with your health care provider about taking a low-dose aspirin or statin. What's next?  Visit your health care provider once a year for a well check visit.  Ask your health care provider how often you should have your eyes and teeth checked.  Stay up to date on all vaccines. This information is not intended to replace advice given to you by your health care provider. Make sure you discuss any questions you have with your health care provider. Document Released: 07/29/2015 Document Revised: 06/26/2018 Document Reviewed: 06/26/2018 Elsevier Patient Education  2020 Elsevier Inc.  

## 2019-03-02 DIAGNOSIS — H25812 Combined forms of age-related cataract, left eye: Secondary | ICD-10-CM | POA: Diagnosis not present

## 2019-03-03 NOTE — H&P (Signed)
Surgical History & Physical  Patient Name: Christian Johnson DOB: March 12, 1941  Surgery: Cataract extraction with intraocular lens implant phacoemulsification; Left Eye  Surgeon: Baruch Goldmann MD Surgery Date:  03/09/2019 Pre-Op Date:  03/03/2019  HPI: [name] 1. The patient complains of difficulty when viewing TV, reading closed caption, news scrolls on TV, which began many years ago. Both eyes and OS is worse than OD; pt states "I know if I close my left eye my vision is clear but when I close my right eye my vision is real bad." is affected. The episode is gradual. The condition's severity is moderate. Symptoms occur when the patient is driving, inside, outside and reading. The complaint is associated with blurry vision. The patient experiences no flashes, no pain with eye movement and pt does notice some floaters which he has seen for years.. This is negatively affecting the patient's quality of life. HPI was performed by Baruch Goldmann .  Medical History: Cataracts Cancer Memory loss, indigestion issues Thyroid Problems  Review of Systems Negative Allergic/Immunologic Negative Cardiovascular Negative Constitutional Negative Ear, Nose, Mouth & Throat Negative Endocrine Negative Eyes Negative Gastrointestinal Negative Genitourinary Negative Hemotologic/Lymphatic Negative Integumentary Negative Musculoskeletal Negative Neurological Negative Psychiatry Negative Respiratory  Social   Never Smoked  Medication Omeprazole, Levothyroxine Sodium, Multivitamin, Omega 3 Supplement, Prevagen,   Sx/Procedures Prostate Ca Radiation, Hip surgery,   Drug Allergies   NKDA  History & Physical: Heent:  Cataract, Left eye NECK: supple without bruits LUNGS: lungs clear to auscultation CV: regular rate and rhythm Abdomen: soft and non-tender  Impression & Plan: Assessment: 1.  COMBINED FORMS AGE RELATED CATARACT; Both Eyes (H25.813) 2.  MACULAR PUCKER; Left Eye  (H35.372)  Plan: 1.  Cataract accounts for the patient's decreased vision. This visual impairment is not correctable with a tolerable change in glasses or contact lenses. Cataract surgery with an implantation of a new lens should significantly improve the visual and functional status of the patient. Discussed all risks, benefits, alternatives, and potential complications. Discussed the procedures and recovery. Patient desires to have surgery. A-scan ordered and performed today for intra-ocular lens calculations. The surgery will be performed in order to improve vision for driving, reading, and for eye examinations. Recommend phacoemulsification with intra-ocular lens. Left Eye significantly worse. Surgery required to correct imbalance of vision. Dilates well - shugarcaine by protocol. 2.  Asymptomatic. Stable. OCT today. Findings, prognosis and treatment options reviewed.

## 2019-03-04 ENCOUNTER — Other Ambulatory Visit: Payer: Self-pay

## 2019-03-04 ENCOUNTER — Encounter (HOSPITAL_COMMUNITY): Payer: Self-pay

## 2019-03-05 ENCOUNTER — Encounter (HOSPITAL_COMMUNITY)
Admission: RE | Admit: 2019-03-05 | Discharge: 2019-03-05 | Disposition: A | Discharge status: Home / Self Care | Payer: Medicare Other | Source: Ambulatory Visit | Attending: Ophthalmology | Admitting: Ophthalmology

## 2019-03-05 ENCOUNTER — Other Ambulatory Visit: Payer: Self-pay

## 2019-03-05 ENCOUNTER — Other Ambulatory Visit (HOSPITAL_COMMUNITY)
Admission: RE | Admit: 2019-03-05 | Discharge: 2019-03-05 | Disposition: A | Discharge status: Home / Self Care | Payer: Medicare Other | Source: Ambulatory Visit | Attending: Ophthalmology | Admitting: Ophthalmology

## 2019-03-05 DIAGNOSIS — H2512 Age-related nuclear cataract, left eye: Secondary | ICD-10-CM | POA: Diagnosis not present

## 2019-03-05 DIAGNOSIS — Z20828 Contact with and (suspected) exposure to other viral communicable diseases: Secondary | ICD-10-CM | POA: Diagnosis not present

## 2019-03-05 DIAGNOSIS — Z01812 Encounter for preprocedural laboratory examination: Secondary | ICD-10-CM | POA: Insufficient documentation

## 2019-03-05 LAB — SARS CORONAVIRUS 2 (TAT 6-24 HRS): SARS Coronavirus 2: NEGATIVE

## 2019-03-09 ENCOUNTER — Ambulatory Visit (HOSPITAL_COMMUNITY): Payer: Medicare Other | Admitting: Anesthesiology

## 2019-03-09 ENCOUNTER — Encounter (HOSPITAL_COMMUNITY)
Admission: RE | Disposition: A | Discharge status: Home / Self Care | Payer: Self-pay | Source: Home / Self Care | Attending: Ophthalmology

## 2019-03-09 ENCOUNTER — Ambulatory Visit (HOSPITAL_COMMUNITY)
Admission: RE | Admit: 2019-03-09 | Discharge: 2019-03-09 | Disposition: A | Discharge status: Home / Self Care | Payer: Medicare Other | Attending: Ophthalmology | Admitting: Ophthalmology

## 2019-03-09 ENCOUNTER — Other Ambulatory Visit: Payer: Self-pay

## 2019-03-09 ENCOUNTER — Encounter (HOSPITAL_COMMUNITY): Payer: Self-pay | Admitting: *Deleted

## 2019-03-09 DIAGNOSIS — E039 Hypothyroidism, unspecified: Secondary | ICD-10-CM | POA: Insufficient documentation

## 2019-03-09 DIAGNOSIS — Z79890 Hormone replacement therapy (postmenopausal): Secondary | ICD-10-CM | POA: Insufficient documentation

## 2019-03-09 DIAGNOSIS — Z8546 Personal history of malignant neoplasm of prostate: Secondary | ICD-10-CM | POA: Insufficient documentation

## 2019-03-09 DIAGNOSIS — Z87891 Personal history of nicotine dependence: Secondary | ICD-10-CM | POA: Insufficient documentation

## 2019-03-09 DIAGNOSIS — H25819 Combined forms of age-related cataract, unspecified eye: Secondary | ICD-10-CM | POA: Diagnosis present

## 2019-03-09 DIAGNOSIS — H35372 Puckering of macula, left eye: Secondary | ICD-10-CM | POA: Diagnosis not present

## 2019-03-09 DIAGNOSIS — I10 Essential (primary) hypertension: Secondary | ICD-10-CM | POA: Insufficient documentation

## 2019-03-09 DIAGNOSIS — H25813 Combined forms of age-related cataract, bilateral: Secondary | ICD-10-CM | POA: Diagnosis not present

## 2019-03-09 DIAGNOSIS — H2512 Age-related nuclear cataract, left eye: Secondary | ICD-10-CM | POA: Diagnosis not present

## 2019-03-09 DIAGNOSIS — H25812 Combined forms of age-related cataract, left eye: Secondary | ICD-10-CM | POA: Diagnosis not present

## 2019-03-09 DIAGNOSIS — K219 Gastro-esophageal reflux disease without esophagitis: Secondary | ICD-10-CM | POA: Insufficient documentation

## 2019-03-09 DIAGNOSIS — Z79899 Other long term (current) drug therapy: Secondary | ICD-10-CM | POA: Diagnosis not present

## 2019-03-09 HISTORY — PX: CATARACT EXTRACTION W/PHACO: SHX586

## 2019-03-09 SURGERY — PHACOEMULSIFICATION, CATARACT, WITH IOL INSERTION
Anesthesia: Monitor Anesthesia Care | Site: Eye | Laterality: Left

## 2019-03-09 MED ORDER — LIDOCAINE HCL 3.5 % OP GEL: 1.0000 "application " | Freq: Once | OPHTHALMIC | Status: DC

## 2019-03-09 MED ORDER — EPINEPHRINE PF 1 MG/ML IJ SOLN
INTRAOCULAR | Status: DC | PRN
  Administered 2019-03-09: 500 mL

## 2019-03-09 MED ORDER — LIDOCAINE HCL (PF) 1 % IJ SOLN
INTRAOCULAR | Status: DC | PRN
  Administered 2019-03-09: 1 mL via OPHTHALMIC

## 2019-03-09 MED ORDER — SODIUM HYALURONATE 23 MG/ML IO SOLN
INTRAOCULAR | Status: DC | PRN
  Administered 2019-03-09: 0.6 mL via INTRAOCULAR

## 2019-03-09 MED ORDER — CYCLOPENTOLATE-PHENYLEPHRINE 0.2-1 % OP SOLN
1.0000 [drp] | OPHTHALMIC | Status: AC | PRN
  Administered 2019-03-09 (×3): 1 [drp] via OPHTHALMIC

## 2019-03-09 MED ORDER — POVIDONE-IODINE 5 % OP SOLN
OPHTHALMIC | Status: DC | PRN
  Administered 2019-03-09: 1 via OPHTHALMIC

## 2019-03-09 MED ORDER — TETRACAINE HCL 0.5 % OP SOLN
1.0000 [drp] | OPHTHALMIC | Status: AC | PRN
  Administered 2019-03-09 (×3): 1 [drp] via OPHTHALMIC

## 2019-03-09 MED ORDER — PHENYLEPHRINE HCL 2.5 % OP SOLN
1.0000 [drp] | OPHTHALMIC | Status: AC | PRN
  Administered 2019-03-09 (×3): 1 [drp] via OPHTHALMIC

## 2019-03-09 MED ORDER — NEOMYCIN-POLYMYXIN-DEXAMETH 3.5-10000-0.1 OP SUSP
OPHTHALMIC | Status: DC | PRN
  Administered 2019-03-09: 1 [drp] via OPHTHALMIC

## 2019-03-09 MED ORDER — BSS IO SOLN
INTRAOCULAR | Status: DC | PRN
  Administered 2019-03-09: 15 mL

## 2019-03-09 MED ORDER — PROVISC 10 MG/ML IO SOLN
INTRAOCULAR | Status: DC | PRN
  Administered 2019-03-09: 0.85 mL via INTRAOCULAR

## 2019-03-09 SURGICAL SUPPLY — 13 items
CLOTH BEACON ORANGE TIMEOUT ST (SAFETY) ×2 IMPLANT
EYE SHIELD UNIVERSAL CLEAR (GAUZE/BANDAGES/DRESSINGS) ×2 IMPLANT
GLOVE BIOGEL PI IND STRL 7.0 (GLOVE) IMPLANT
GLOVE BIOGEL PI INDICATOR 7.0 (GLOVE) ×2
LENS ALC ACRYL/TECN (Ophthalmic Related) ×2 IMPLANT
NDL HYPO 18GX1.5 BLUNT FILL (NEEDLE) IMPLANT
NEEDLE HYPO 18GX1.5 BLUNT FILL (NEEDLE) ×3 IMPLANT
PAD ARMBOARD 7.5X6 YLW CONV (MISCELLANEOUS) ×2 IMPLANT
SYR TB 1ML LL NO SAFETY (SYRINGE) ×2 IMPLANT
TAPE SURG TRANSPORE 1 IN (GAUZE/BANDAGES/DRESSINGS) IMPLANT
TAPE SURGICAL TRANSPORE 1 IN (GAUZE/BANDAGES/DRESSINGS) ×2
VISCOELASTIC ADDITIONAL (OPHTHALMIC RELATED) ×2 IMPLANT
WATER STERILE IRR 250ML POUR (IV SOLUTION) ×2 IMPLANT

## 2019-03-09 NOTE — Op Note (Signed)
Date of procedure: 03/09/19  Pre-operative diagnosis: Visually significant age-related cataract, Left Eye (H25.812)  Post-operative diagnosis: Visually significant age-related cataract, Left Eye  Procedure: Removal of cataract via phacoemulsification and insertion of intra-ocular lens [employer] PCB00  +22.5D into the capsular bag of the Left Eye  Attending surgeon: Gerda Diss. Corita Allinson, MD, MA  Anesthesia: MAC, Topical Akten  Complications: None  Estimated Blood Loss: <51m (minimal)  Specimens: None  Implants: As above  Indications:  Visually significant age-related cataract, Left Eye  Procedure:  The patient was seen and identified in the pre-operative area. The operative eye was identified and dilated.  The operative eye was marked.  Topical anesthesia was administered to the operative eye.     The patient was then to the operative suite and placed in the supine position.  A timeout was performed confirming the patient, procedure to be performed, and all other relevant information.   The patient's face was prepped and draped in the usual fashion for intra-ocular surgery.  A lid speculum was placed into the operative eye and the surgical microscope moved into place and focused.  An inferotemporal paracentesis was created using a 20 gauge paracentesis blade.  Shugarcaine was injected into the anterior chamber.  Viscoelastic was injected into the anterior chamber.  A temporal clear-corneal main wound incision was created using a 2.463mmicrokeratome.  A continuous curvilinear capsulorrhexis was initiated using an irrigating cystitome and completed using capsulorrhexis forceps.  Hydrodissection and hydrodeliniation were performed.  Viscoelastic was injected into the anterior chamber.  A phacoemulsification handpiece and a chopper as a second instrument were used to remove the nucleus and epinucleus. The irrigation/aspiration handpiece was used to remove any remaining cortical  material.   The capsular bag was reinflated with viscoelastic, checked, and found to be intact.  The intraocular lens was inserted into the capsular bag.  The irrigation/aspiration handpiece was used to remove any remaining viscoelastic.  The clear corneal wound and paracentesis wounds were then hydrated and checked with Weck-Cels to be watertight.  The lid-speculum and drape was removed, and the patient's face was cleaned with a wet and dry 4x4.  Maxitrol was instilled in the eye before a clear shield was taped over the eye. The patient was taken to the post-operative care unit in good condition, having tolerated the procedure well.  Post-Op Instructions: [facility] and will receive all other orders and instructions.

## 2019-03-09 NOTE — Anesthesia Preprocedure Evaluation (Signed)
Anesthesia Evaluation  Patient identified by MRN, date of birth, ID band Patient awake    Reviewed: Allergy & Precautions, NPO status , Patient's Chart, lab work & pertinent test results  History of Anesthesia Complications Negative for: history of anesthetic complications  Airway Mallampati: III  TM Distance: >3 FB     Dental  (+) Upper Dentures,    Pulmonary former smoker,    breath sounds clear to auscultation       Cardiovascular Exercise Tolerance: Good Hypertension: not on meds.  Rhythm:Regular Rate:Normal     Neuro/Psych negative neurological ROS     GI/Hepatic Neg liver ROS, GERD  Controlled,  Endo/Other  Hypothyroidism   Renal/GU negative Renal ROS     Musculoskeletal   Abdominal   Peds  Hematology   Anesthesia Other Findings   Reproductive/Obstetrics                             Anesthesia Physical Anesthesia Plan  ASA: II  Anesthesia Plan: MAC   Post-op Pain Management:    Induction:   PONV Risk Score and Plan:   Airway Management Planned: Nasal Cannula  Additional Equipment:   Intra-op Plan:   Post-operative Plan:   Informed Consent: I have reviewed the patients History and Physical, chart, labs and discussed the procedure including the risks, benefits and alternatives for the proposed anesthesia with the patient or authorized representative who has indicated his/her understanding and acceptance.     Dental advisory given  Plan Discussed with: CRNA  Anesthesia Plan Comments:         Anesthesia Quick Evaluation

## 2019-03-09 NOTE — Transfer of Care (Signed)
Immediate Anesthesia Transfer of Care Note  Patient: Christian Johnson  Procedure(s) Performed: CATARACT EXTRACTION PHACO AND INTRAOCULAR LENS PLACEMENT ([facility]) (Left Eye)  Patient Location: Short Stay  Anesthesia Type:MAC  Level of Consciousness: awake, alert , oriented and patient cooperative  Airway & Oxygen Therapy: Patient Spontanous Breathing and Patient connected to nasal cannula oxygen  Post-op Assessment: Report given to RN and Post -op Vital signs reviewed and stable  Post vital signs: Reviewed and stable  Last Vitals:  Vitals Value Taken Time  BP    Temp    Pulse    Resp    SpO2      Last Pain:  Vitals:   03/09/19 1014  TempSrc: Oral  PainSc: 0-No pain         Complications: No apparent anesthesia complications

## 2019-03-09 NOTE — Anesthesia Postprocedure Evaluation (Signed)
Anesthesia Post Note  Patient: Christian Johnson  Procedure(s) Performed: CATARACT EXTRACTION PHACO AND INTRAOCULAR LENS PLACEMENT ([place]) (Left Eye)  Patient location during evaluation: Short Stay Anesthesia Type: MAC Level of consciousness: awake and alert and oriented Pain management: pain level controlled Vital Signs Assessment: post-procedure vital signs reviewed and stable Respiratory status: spontaneous breathing and respiratory function stable Cardiovascular status: stable Postop Assessment: no apparent nausea or vomiting Anesthetic complications: no     Last Vitals:  Vitals:   03/09/19 1014  BP: (!) 169/83  Pulse: 62  Resp: (!) 22  Temp: 36.8 C  SpO2: 99%    Last Pain:  Vitals:   03/09/19 1014  TempSrc: Oral  PainSc: 0-No pain                 ADAMS, AMY A

## 2019-03-09 NOTE — Discharge Instructions (Signed)
Please discharge patient when stable, will follow up today with Dr. Ithan Touhey at the [facility] office immediately following discharge.  Leave shield in place until visit.  All paperwork with discharge instructions will be given at the office. ° °

## 2019-03-09 NOTE — Interval H&P Note (Signed)
History and Physical Interval Note: The H and P was reviewed and updated. The patient was examined.  No changes were found after exam.  The surgical eye was marked.  03/09/2019 10:37 AM  Christian Johnson  has presented today for surgery, with the diagnosis of nuclear cataract left eye.  The various methods of treatment have been discussed with the patient and family. After consideration of risks, benefits and other options for treatment, the patient has consented to  Procedure(s) with comments: CATARACT EXTRACTION PHACO AND INTRAOCULAR LENS PLACEMENT ([place]) (Left) - left as a surgical intervention.  The patient's history has been reviewed, patient examined, no change in status, stable for surgery.  I have reviewed the patient's chart and labs.  Questions were answered to the patient's satisfaction.     Baruch Goldmann

## 2019-03-09 NOTE — Anesthesia Procedure Notes (Signed)
Procedure Name: MAC Performed by: Eulalia Ellerman A, CRNA Pre-anesthesia Checklist: Patient identified, Emergency Drugs available, Suction available, Timeout performed and Patient being monitored Patient Re-evaluated:Patient Re-evaluated prior to induction Oxygen Delivery Method: Nasal Cannula       

## 2019-03-10 ENCOUNTER — Encounter (HOSPITAL_COMMUNITY): Payer: Self-pay | Admitting: Ophthalmology

## 2019-04-03 ENCOUNTER — Encounter: Payer: Self-pay | Admitting: *Deleted

## 2019-04-09 ENCOUNTER — Other Ambulatory Visit: Payer: Self-pay

## 2019-04-10 ENCOUNTER — Encounter: Payer: Self-pay | Admitting: Family Medicine

## 2019-04-10 ENCOUNTER — Ambulatory Visit (INDEPENDENT_AMBULATORY_CARE_PROVIDER_SITE_OTHER): Payer: Medicare Other | Admitting: Family Medicine

## 2019-04-10 VITALS — BP 153/64 | HR 71 | Temp 95.3°F | Ht 73.0 in | Wt 174.0 lb

## 2019-04-10 DIAGNOSIS — F0390 Unspecified dementia without behavioral disturbance: Secondary | ICD-10-CM | POA: Diagnosis not present

## 2019-04-10 DIAGNOSIS — Z23 Encounter for immunization: Secondary | ICD-10-CM

## 2019-04-10 DIAGNOSIS — F03A0 Unspecified dementia, mild, without behavioral disturbance, psychotic disturbance, mood disturbance, and anxiety: Secondary | ICD-10-CM

## 2019-04-10 MED ORDER — DONEPEZIL HCL 10 MG PO TABS: 10.0000 mg | ORAL_TABLET | Freq: Every day | ORAL | 3 refills | Status: DC
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
## 2019-04-11 LAB — CBC WITH DIFFERENTIAL/PLATELET
Basophils Absolute: 0 10*3/uL (ref 0.0–0.2)
Basos: 1 %
EOS (ABSOLUTE): 0.2 10*3/uL (ref 0.0–0.4)
Eos: 2 %
Hematocrit: 40.2 % (ref 37.5–51.0)
Hemoglobin: 14.2 g/dL (ref 13.0–17.7)
Immature Grans (Abs): 0 10*3/uL (ref 0.0–0.1)
Immature Granulocytes: 0 %
Lymphocytes Absolute: 0.9 10*3/uL (ref 0.7–3.1)
Lymphs: 13 %
MCH: 33 pg (ref 26.6–33.0)
MCHC: 35.3 g/dL (ref 31.5–35.7)
MCV: 94 fL (ref 79–97)
Monocytes Absolute: 0.5 10*3/uL (ref 0.1–0.9)
Monocytes: 8 %
Neutrophils Absolute: 5.4 10*3/uL (ref 1.4–7.0)
Neutrophils: 76 %
Platelets: 177 10*3/uL (ref 150–450)
RBC: 4.3 x10E6/uL (ref 4.14–5.80)
RDW: 12.7 % (ref 11.6–15.4)
WBC: 7 10*3/uL (ref 3.4–10.8)

## 2019-04-11 LAB — FOLATE: Folate: 17.9 ng/mL (ref >3.0)

## 2019-04-11 LAB — TSH: TSH: 2.02 u[IU]/mL (ref 0.450–4.500)

## 2019-04-11 LAB — VITAMIN B12: Vitamin B-12: 498 pg/mL (ref 232–1245)

## 2019-04-13 ENCOUNTER — Telehealth: Payer: Self-pay | Admitting: Family Medicine

## 2019-04-13 MED ORDER — MEMANTINE HCL ER 7 MG PO CP24: 7.0000 mg | ORAL_CAPSULE | Freq: Every day | ORAL | 1 refills | Status: DC

## 2019-04-13 NOTE — Telephone Encounter (Signed)
Wife aware and verbalizes understanding per dpr.  °

## 2019-04-13 NOTE — Telephone Encounter (Signed)
Patient was intolerant of Aricept so have him go ahead and stop it, I sent in the other medication that is a different type and has less side effects called Namenda and he can try that one to see if it helps.

## 2019-05-07 ENCOUNTER — Other Ambulatory Visit: Payer: Self-pay | Admitting: Family Medicine

## 2019-05-18 DIAGNOSIS — Z85828 Personal history of other malignant neoplasm of skin: Secondary | ICD-10-CM | POA: Diagnosis not present

## 2019-05-18 DIAGNOSIS — L570 Actinic keratosis: Secondary | ICD-10-CM | POA: Diagnosis not present

## 2019-05-18 DIAGNOSIS — C44329 Squamous cell carcinoma of skin of other parts of face: Secondary | ICD-10-CM | POA: Diagnosis not present

## 2019-05-18 DIAGNOSIS — Z08 Encounter for follow-up examination after completed treatment for malignant neoplasm: Secondary | ICD-10-CM | POA: Diagnosis not present

## 2019-05-18 DIAGNOSIS — X32XXXD Exposure to sunlight, subsequent encounter: Secondary | ICD-10-CM | POA: Diagnosis not present

## 2019-06-22 DIAGNOSIS — Z08 Encounter for follow-up examination after completed treatment for malignant neoplasm: Secondary | ICD-10-CM | POA: Diagnosis not present

## 2019-06-22 DIAGNOSIS — X32XXXD Exposure to sunlight, subsequent encounter: Secondary | ICD-10-CM | POA: Diagnosis not present

## 2019-06-22 DIAGNOSIS — L570 Actinic keratosis: Secondary | ICD-10-CM | POA: Diagnosis not present

## 2019-06-22 DIAGNOSIS — Z85828 Personal history of other malignant neoplasm of skin: Secondary | ICD-10-CM | POA: Diagnosis not present

## 2019-07-02 ENCOUNTER — Other Ambulatory Visit: Payer: Self-pay | Admitting: Family Medicine

## 2019-07-02 DIAGNOSIS — F03A0 Unspecified dementia, mild, without behavioral disturbance, psychotic disturbance, mood disturbance, and anxiety: Secondary | ICD-10-CM

## 2019-07-02 DIAGNOSIS — F0390 Unspecified dementia without behavioral disturbance: Secondary | ICD-10-CM

## 2019-07-15 ENCOUNTER — Ambulatory Visit: Payer: Medicare Other | Admitting: Family Medicine

## 2019-07-16 ENCOUNTER — Ambulatory Visit: Payer: Medicare Other | Admitting: Family Medicine

## 2019-07-20 ENCOUNTER — Other Ambulatory Visit: Payer: Self-pay

## 2019-07-21 ENCOUNTER — Ambulatory Visit (INDEPENDENT_AMBULATORY_CARE_PROVIDER_SITE_OTHER): Payer: Medicare Other | Admitting: Family Medicine

## 2019-07-21 ENCOUNTER — Encounter: Payer: Self-pay | Admitting: Family Medicine

## 2019-07-21 VITALS — BP 151/72 | HR 78 | Temp 97.7°F | Ht 73.0 in | Wt 175.6 lb

## 2019-07-21 DIAGNOSIS — Z13220 Encounter for screening for lipoid disorders: Secondary | ICD-10-CM | POA: Diagnosis not present

## 2019-07-21 DIAGNOSIS — E039 Hypothyroidism, unspecified: Secondary | ICD-10-CM

## 2019-07-21 DIAGNOSIS — Z8546 Personal history of malignant neoplasm of prostate: Secondary | ICD-10-CM

## 2019-07-21 DIAGNOSIS — R413 Other amnesia: Secondary | ICD-10-CM | POA: Insufficient documentation

## 2019-07-21 DIAGNOSIS — K219 Gastro-esophageal reflux disease without esophagitis: Secondary | ICD-10-CM

## 2019-07-21 DIAGNOSIS — F0390 Unspecified dementia without behavioral disturbance: Secondary | ICD-10-CM | POA: Insufficient documentation

## 2019-07-21 MED ORDER — MEMANTINE HCL ER 14 MG PO CP24: 14.0000 mg | ORAL_CAPSULE | Freq: Every day | ORAL | 1 refills | Status: DC

## 2019-07-21 NOTE — Progress Notes (Signed)
BP (!) 151/72   Pulse 78   Temp 97.7 F (36.5 C) (Temporal)   Ht '6\' 1"'  (1.854 m)   Wt 175 lb 9.6 oz (79.7 kg)   SpO2 99%   BMI 23.17 kg/m    Subjective:   Patient ID: Christian Johnson, male    DOB: Aug 17, 1940, 79 y.o.   MRN: [id number]  HPI: Christian Johnson is a 79 y.o. male presenting on 07/21/2019 for Hypothyroidism (6 month follow up)   HPI Hypothyroidism recheck Patient is coming in for thyroid recheck today as well. They deny any issues with hair changes or heat or cold problems or diarrhea or constipation. They deny any chest pain or palpitations. They are currently on levothyroxine 88 micrograms   GERD Patient is currently on omeprazole.  She denies any major symptoms or abdominal pain or belching or burping. She denies any blood in her stool or lightheadedness or dizziness.   Patient comes in for recheck of memory issues and mild dementia likely.  He has been taking the Namenda at 7 mg and he has not really noticed much difference and his wife says same.  We will increase the Namenda to 14 mg.  He denies any side effects from it.  Relevant past medical, surgical, family and social history reviewed and updated as indicated. Interim medical history since our last visit reviewed. Allergies and medications reviewed and updated.  Review of Systems  Constitutional: Negative for chills and fever.  Eyes: Negative for visual disturbance.  Respiratory: Negative for shortness of breath and wheezing.   Cardiovascular: Negative for chest pain and leg swelling.  Musculoskeletal: Positive for myalgias and neck pain. Negative for back pain, gait problem and neck stiffness.  Skin: Negative for rash.  Neurological: Negative for dizziness, weakness and numbness.  All other systems reviewed and are negative.   Per HPI unless specifically indicated above   Allergies as of 07/21/2019   No Known Allergies     Medication List       Accurate as of July 21, 2019  9:35 AM. If you have any  questions, ask your nurse or doctor.        Aleve 220 MG tablet Generic drug: naproxen sodium Take 220 mg by mouth daily as needed (for pain or headache).   Fish Oil 500 MG Caps Take 500 mg by mouth daily.   GenTeal Mild 0.2 % Soln Generic drug: Hypromellose Place 1 drop into both eyes daily.   levothyroxine 88 MCG tablet Commonly known as: SYNTHROID Take 1 tablet (88 mcg total) by mouth daily before breakfast.   memantine 14 MG Cp24 24 hr capsule Commonly known as: NAMENDA XR Take 1 capsule (14 mg total) by mouth daily. What changed:   medication strength  how much to take Changed by: Worthy Rancher, MD   multivitamin with minerals Tabs tablet Take 1 tablet by mouth daily. Centrum Silver   omeprazole 20 MG capsule Commonly known as: PRILOSEC Take 1 capsule (20 mg total) by mouth every morning. What changed: when to take this   Prevagen 10 MG Caps Generic drug: Apoaequorin Take 10 mg by mouth daily.        Objective:   BP (!) 151/72   Pulse 78   Temp 97.7 F (36.5 C) (Temporal)   Ht '6\' 1"'  (1.854 m)   Wt 175 lb 9.6 oz (79.7 kg)   SpO2 99%   BMI 23.17 kg/m   Wt Readings from Last 3 Encounters:  07/21/19 175 lb 9.6 oz (79.7 kg)  04/10/19 174 lb (78.9 kg)  01/12/19 172 lb 12.8 oz (78.4 kg)    Physical Exam Vitals and nursing note reviewed.  Constitutional:      General: He is not in acute distress.    Appearance: He is well-developed. He is not diaphoretic.  Eyes:     General: No scleral icterus.    Conjunctiva/sclera: Conjunctivae normal.  Neck:     Thyroid: No thyromegaly.  Cardiovascular:     Rate and Rhythm: Normal rate and regular rhythm.     Heart sounds: Normal heart sounds. No murmur.  Pulmonary:     Effort: Pulmonary effort is normal. No respiratory distress.     Breath sounds: Normal breath sounds. No wheezing.  Musculoskeletal:        General: Normal range of motion.     Cervical back: Neck supple. Tenderness (Paraspinal  muscular tenderness) present. No spasms, torticollis or bony tenderness. Pain with movement (Pain with rotation of the head about the neck to both sides) present. Normal range of motion.  Lymphadenopathy:     Cervical: No cervical adenopathy.  Skin:    General: Skin is warm and dry.     Findings: No rash.  Neurological:     Mental Status: He is alert and oriented to person, place, and time.     Coordination: Coordination normal.  Psychiatric:        Behavior: Behavior normal.       Assessment & Plan:   Problem List Items Addressed This Visit      Digestive   GERD (gastroesophageal reflux disease)   Relevant Orders   CBC with Differential/Platelet   CMP14+EGFR     Endocrine   Hypothyroidism - Primary   Relevant Orders   CMP14+EGFR   TSH     Other   History of prostate cancer   Relevant Orders   CMP14+EGFR   PSA, total and free   Memory difficulties   Relevant Medications   memantine (NAMENDA XR) 14 MG CP24 24 hr capsule    Other Visit Diagnoses    Lipid screening       Relevant Orders   Lipid panel    Patient says he has not noticed a difference with Namenda 7 mg so we will increase it to 14 mg.  He denies any side effects so we will go ahead and increase that.  Patient's other medications appear to be doing well and we will recheck his thyroid and blood work.  Follow up plan: Return in about 6 months (around 01/18/2020), or if symptoms worsen or fail to improve, for Thyroid and prostate.  Counseling provided for all of the vaccine components Orders Placed This Encounter  Procedures  . CBC with Differential/Platelet  . CMP14+EGFR  . Lipid panel  . PSA, total and free  . TSH    Caryl Pina, MD [facility] 07/21/2019, 9:35 AM

## 2019-07-22 LAB — LIPID PANEL
Chol/HDL Ratio: 3.6 ratio (ref 0.0–5.0)
Cholesterol, Total: 220 mg/dL — ABNORMAL HIGH (ref 100–199)
HDL: 61 mg/dL (ref >39)
LDL Chol Calc (NIH): 145 mg/dL — ABNORMAL HIGH (ref 0–99)
Triglycerides: 80 mg/dL (ref 0–149)
VLDL Cholesterol Cal: 14 mg/dL (ref 5–40)

## 2019-07-22 LAB — CMP14+EGFR
ALT: 17 IU/L (ref 0–44)
AST: 27 IU/L (ref 0–40)
Albumin/Globulin Ratio: 2.2 (ref 1.2–2.2)
Albumin: 4.3 g/dL (ref 3.7–4.7)
Alkaline Phosphatase: 104 IU/L (ref 39–117)
BUN/Creatinine Ratio: 17 (ref 10–24)
BUN: 17 mg/dL (ref 8–27)
Bilirubin Total: 0.8 mg/dL (ref 0.0–1.2)
CO2: 27 mmol/L (ref 20–29)
Calcium: 9.2 mg/dL (ref 8.6–10.2)
Chloride: 102 mmol/L (ref 96–106)
Creatinine, Ser: 1 mg/dL (ref 0.76–1.27)
GFR calc Af Amer: 84 mL/min/{1.73_m2} (ref >59)
GFR calc non Af Amer: 72 mL/min/{1.73_m2} (ref >59)
Globulin, Total: 2 g/dL (ref 1.5–4.5)
Glucose: 80 mg/dL (ref 65–99)
Potassium: 4.1 mmol/L (ref 3.5–5.2)
Sodium: 140 mmol/L (ref 134–144)
Total Protein: 6.3 g/dL (ref 6.0–8.5)

## 2019-07-22 LAB — CBC WITH DIFFERENTIAL/PLATELET
Basophils Absolute: 0 10*3/uL (ref 0.0–0.2)
Basos: 1 %
EOS (ABSOLUTE): 0.2 10*3/uL (ref 0.0–0.4)
Eos: 3 %
Hematocrit: 40.9 % (ref 37.5–51.0)
Hemoglobin: 14.2 g/dL (ref 13.0–17.7)
Immature Grans (Abs): 0 10*3/uL (ref 0.0–0.1)
Immature Granulocytes: 0 %
Lymphocytes Absolute: 0.8 10*3/uL (ref 0.7–3.1)
Lymphs: 15 %
MCH: 33.1 pg — ABNORMAL HIGH (ref 26.6–33.0)
MCHC: 34.7 g/dL (ref 31.5–35.7)
MCV: 95 fL (ref 79–97)
Monocytes Absolute: 0.7 10*3/uL (ref 0.1–0.9)
Monocytes: 13 %
Neutrophils Absolute: 3.4 10*3/uL (ref 1.4–7.0)
Neutrophils: 68 %
Platelets: 178 10*3/uL (ref 150–450)
RBC: 4.29 x10E6/uL (ref 4.14–5.80)
RDW: 13 % (ref 11.6–15.4)
WBC: 5.1 10*3/uL (ref 3.4–10.8)

## 2019-07-22 LAB — PSA, TOTAL AND FREE
PSA, Free Pct: 10 %
PSA, Free: 0.04 ng/mL
Prostate Specific Ag, Serum: 0.4 ng/mL (ref 0.0–4.0)

## 2019-07-22 LAB — TSH: TSH: 6.74 u[IU]/mL — ABNORMAL HIGH (ref 0.450–4.500)

## 2019-07-24 ENCOUNTER — Other Ambulatory Visit: Payer: Self-pay | Admitting: *Deleted

## 2019-07-24 ENCOUNTER — Telehealth: Payer: Self-pay | Admitting: Family Medicine

## 2019-07-24 MED ORDER — ATORVASTATIN CALCIUM 20 MG PO TABS: 20.0000 mg | ORAL_TABLET | Freq: Every day | ORAL | 1 refills | Status: DC

## 2019-07-24 MED ORDER — LEVOTHYROXINE SODIUM 100 MCG PO TABS: 100.0000 ug | ORAL_TABLET | Freq: Every day | ORAL | 1 refills | Status: DC

## 2019-07-24 NOTE — Progress Notes (Signed)
Rx sent in per MD notes on results.

## 2019-07-24 NOTE — Telephone Encounter (Signed)
Daughter aware and verbalizes understanding per dpr.

## 2019-08-03 ENCOUNTER — Ambulatory Visit: Payer: Medicare Other

## 2019-08-20 ENCOUNTER — Ambulatory Visit (INDEPENDENT_AMBULATORY_CARE_PROVIDER_SITE_OTHER): Payer: Medicare Other | Admitting: *Deleted

## 2019-08-20 DIAGNOSIS — Z0000 Encounter for general adult medical examination without abnormal findings: Secondary | ICD-10-CM

## 2019-08-20 NOTE — Progress Notes (Signed)
MEDICARE ANNUAL WELLNESS VISIT  08/20/2019  Telephone Visit Disclaimer This Medicare AWV was conducted by telephone due to national recommendations for restrictions regarding the COVID-19 Pandemic (e.g. social distancing).  I verified, using two identifiers, that I am speaking with Christian Johnson or their authorized healthcare agent. I discussed the limitations, risks, security, and privacy concerns of performing an evaluation and management service by telephone and the potential availability of an in-person appointment in the future. The patient expressed understanding and agreed to proceed.   Subjective:  Christian Johnson is a 79 y.o. male patient of Dettinger, Fransisca Kaufmann, MD who had a Medicare Annual Wellness Visit today via telephone. Timmie is Working part time at a Cur[employer] home and lives with his wife. He did retire from Charity fundraiser. He has 2 adult children. He reports that he is socially active and does interact with friends/family regularly.He is not physically active and enjoys woodworking. He is active in his church. He does not have any pets in the home. Fall hazards were discussed with patient.  Patient Care Team: Dettinger, Fransisca Kaufmann, MD as PCP - General (Family Medicine)  Advanced Directives 08/20/2019 03/09/2019 02/24/2019 11/06/2017 01/31/2012  Does Patient Have a Medical Advance Directive? No No No Yes Patient has advance directive, copy not in chart  Type of Advance Directive - - - Healthcare Power of [place];Living will Living will  Copy of [place] in Chart? - - - No - copy requested -  Would patient like information on creating a medical advance directive? No - Patient declined No - Patient declined No - Patient declined - -  Pre-existing out of facility DNR order (yellow form or pink MOST form) - - - - No    Hospital Utilization Over the Past 12 Months: # of hospitalizations or ER visits: 0 # of surgeries: 1  Review of Systems    Patient reports that  his overall health is unchanged compared to last year.  History obtained from chart review, the patient and wife Musculoskeletal ROS: positive for - joint pain  Patient Reported Readings (BP, Pulse, CBG, Weight, etc) none  Pain Assessment Pain : 0-10 Pain Score: 2  Pain Type: Chronic pain Pain Orientation: Other (Comment)(Joint pain) Pain Frequency: Occasional Pain Relieving Factors: Getting up and moving around  Pain Relieving Factors: Getting up and moving around  Current Medications & Allergies (verified) Allergies as of 08/20/2019   No Known Allergies     Medication List       Accurate as of August 20, 2019  8:56 AM. If you have any questions, ask your nurse or doctor.        STOP taking these medications   Prevagen 10 MG Caps Generic drug: Apoaequorin     TAKE these medications   Aleve 220 MG tablet Generic drug: naproxen sodium Take 220 mg by mouth daily as needed (for pain or headache).   atorvastatin 20 MG tablet Commonly known as: LIPITOR Take 1 tablet (20 mg total) by mouth daily.   Fish Oil 500 MG Caps Take 500 mg by mouth daily.   GenTeal Mild 0.2 % Soln Generic drug: Hypromellose Place 1 drop into both eyes daily.   levothyroxine 100 MCG tablet Commonly known as: SYNTHROID Take 1 tablet (100 mcg total) by mouth daily.   memantine 14 MG Cp24 24 hr capsule Commonly known as: NAMENDA XR Take 1 capsule (14 mg total) by mouth daily.   multivitamin with minerals Tabs tablet  Take 1 tablet by mouth daily. Centrum Silver   omeprazole 20 MG capsule Commonly known as: PRILOSEC Take 1 capsule (20 mg total) by mouth every morning. What changed: when to take this       History (reviewed): Past Medical History:  Diagnosis Date  . Cancer [facility])    Prostate  . Cataract   . Colon polyps   . GERD (gastroesophageal reflux disease)   . Hyperlipidemia   . Hypothyroidism   . Memory change   . Prostate cancer [facility])    Past Surgical History:   Procedure Laterality Date  . CATARACT EXTRACTION W/PHACO Left 03/09/2019   Procedure: CATARACT EXTRACTION PHACO AND INTRAOCULAR LENS PLACEMENT (IOC);  Surgeon: Baruch Goldmann, MD;  Location: [facility] ORS;  Service: Ophthalmology;  Laterality: Left;  left, CDE: 7.70  . COLONOSCOPY  01/31/2012   Procedure: COLONOSCOPY;  Surgeon: Rogene Houston, MD;  Location: [facility];  Service: Endoscopy;  Laterality: N/A;  930  . COLONOSCOPY N/A 11/06/2017   Procedure: COLONOSCOPY;  Surgeon: Rogene Houston, MD;  Location: [facility];  Service: Endoscopy;  Laterality: N/A;  830  . Colonoscopy with polyp removal    . EYE SURGERY Left 07/08/2018   cataracts  . POLYPECTOMY  11/06/2017   Procedure: POLYPECTOMY;  Surgeon: Rogene Houston, MD;  Location: [facility];  Service: Endoscopy;;  cecal (CB), transverse colon (CB), rectal polyp (HS)  . Right hip core decompression     Family History  Problem Relation Age of Onset  . Breast cancer Mother   . Cancer Father        Lung  . Hypertension Son   . Kidney disease Son   . Kidney cancer Son   . Colon cancer Neg Hx    Social History   Socioeconomic History  . Marital status: Married    Spouse name: Christian Johnson  . Number of children: 2  . Years of education: 33  . Highest education level: High school graduate  Occupational History  . 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 Smoking status: Former Smoker    Packs/day: 1.00    Years: 25.00    Pack years: 25.00    Types: Cigarettes    Quit date: 02/24/1999    Years since quitting: 20.4  . Smokeless tobacco: Never Used  Substance and Sexual Activity  . Alcohol use: No  . Drug use: No  . Sexual activity: Not Currently  Other Topics Concern  . Not on file  Social History Narrative  . Not on file   Social Determinants of Health   Financial Resource Strain: Low Risk   . Difficulty of Paying Living Expenses: Not hard at all  Food Insecurity: No Food Insecurity  . Worried  About Charity fundraiser in the Last Year: Never true  . Ran Out of Food in the Last Year: Never true  Transportation Needs: No Transportation Needs  . Lack of Transportation (Medical): No  . Lack of Transportation (Non-Medical): No  Physical Activity: Inactive  . Days of Exercise per Week: 0 days  . Minutes of Exercise per Session: 0 min  Stress: No Stress Concern Present  . Feeling of Stress : Not at all  Social Connections: Not Isolated  . Frequency of Communication with Friends and Family: More than three times a week  . Frequency of Social Gatherings with Friends and Family: More than three times a week  . Attends Religious Services:  More than 4 times per year  . Active Member of Clubs or Organizations: Yes  . Attends Archivist Meetings: More than 4 times per year  . Marital Status: Married    Activities of Daily Living In your present state of health, do you have any difficulty performing the following activities: 08/20/2019 02/24/2019  Hearing? Tempie Donning  Comment wears hearing aids wears bilateral hearing aids  Vision? N Y  Comment Wears glasses has surgery scheduled for 03/12/19 to have cataract removed from left eye  Difficulty concentrating or making decisions? Y Y  Comment Per patient he does memory isn't as good as it used to be  Walking or climbing stairs? N N  Dressing or bathing? N N  Doing errands, shopping? N N  Preparing Food and eating ? N N  Using the Toilet? N N  In the past six months, have you accidently leaked urine? N N  Do you have problems with loss of bowel control? N N  Managing your Medications? N N  Managing your Finances? N N  Housekeeping or managing your Housekeeping? N N  Some recent data might be hidden    Patient Education/ Literacy How often do you need to have someone help you when you read instructions, pamphlets, or other written materials from your doctor or pharmacy?: 4 - Often What is the last grade level you completed in  school?: 12th  Exercise Current Exercise Habits: The patient does not participate in regular exercise at present  Diet Patient reports consuming 3 meals a day and 2 snack(s) a day Patient reports that his primary diet is: Low Sodium Patient reports that she does have regular access to food.   Depression Screen PHQ 2/9 Scores 08/20/2019 08/20/2019 07/21/2019 04/10/2019 02/24/2019 01/12/2019  PHQ - 2 Score 1 1 0 0 1 0  PHQ- 9 Score 3 - - - - -     Fall Risk Fall Risk  08/20/2019 07/21/2019 04/10/2019 02/24/2019 01/12/2019  Falls in the past year? 0 0 0 0 0     Objective:  GERRID BARKSDALE seemed alert and oriented and he participated appropriately during our telephone visit.  Blood Pressure Weight BMI  BP Readings from Last 3 Encounters:  07/21/19 (!) 151/72  04/10/19 (!) 153/64  03/09/19 (!) 166/83   Wt Readings from Last 3 Encounters:  07/21/19 175 lb 9.6 oz (79.7 kg)  04/10/19 174 lb (78.9 kg)  01/12/19 172 lb 12.8 oz (78.4 kg)   BMI Readings from Last 1 Encounters:  07/21/19 23.17 kg/m    *Unable to obtain current vital signs, weight, and BMI due to telephone visit type  Hearing/Vision  . Jamai did  seem to have difficulty with hearing/understanding during the telephone conversation . Reports that he has had a formal eye exam by an eye care professional within the past year . Reports that he has not had a formal hearing evaluation within the past year *Unable to fully assess hearing and vision during telephone visit type  Cognitive Function: 6CIT Screen 08/20/2019 02/24/2019  What Year? 0 points 0 points  What month? 0 points 0 points  What time? 0 points 0 points  Count back from 20 0 points 0 points  Months in reverse 4 points 4 points  Repeat phrase 6 points 4 points  Total Score 10 8   (Normal:0-7, Significant for Dysfunction: >8)  Normal Cognitive Function Screening: No: Patient states he does have some memory loss and has recently started on namenda.  Immunization &  Health Maintenance Record Immunization History  Administered Date(s) Administered  . Fluad Quad(high Dose 65+) 04/10/2019  . Influenza, High Dose Seasonal PF 03/17/2015, 04/18/2016, 04/23/2017, 04/15/2018  . Pneumococcal Conjugate-13 07/27/2015  . Pneumococcal Polysaccharide-23 12/19/2016  . Tdap 01/01/2011    Health Maintenance  Topic Date Due  . Samul Dada  12/31/2020  . COLONOSCOPY  11/07/2022  . INFLUENZA VACCINE  Completed  . PNA vac Low Risk Adult  Completed       Assessment  This is a routine wellness examination for BRAELIN AMON.  Health Maintenance: Due or Overdue There are no preventive care reminders to display for this patient.  Christian Johnson does not need a referral for Community Assistance: Care Management:   no Social Work:    no Prescription Assistance:  no Nutrition/Diabetes Education:  no   Plan:  Personalized Goals Goals Addressed            This Visit's Progress   . AWV       08/20/2019 AWV Goal: Fall Prevention  . Over the next year, patient will decrease their risk for falls by: o Using assistive devices, such as a cane or walker, as needed o Identifying fall risks within their home and correcting them by: - Removing throw rugs - Adding handrails to stairs or ramps - Removing clutter and keeping a clear pathway throughout the home - Increasing light, especially at night - Adding shower handles/bars - Raising toilet seat o Identifying potential personal risk factors for falls: - Medication side effects - Incontinence/urgency - Vestibular dysfunction - Hearing loss - Musculoskeletal disorders - Neurological disorders - Orthostatic hypotension  08/20/2019 AWV Goal: Exercise for General Health   Patient will verbalize understanding of the benefits of increased physical activity:  Exercising regularly is important. It will improve your overall fitness, flexibility, and endurance.  Regular exercise also will improve your overall  health. It can help you control your weight, reduce stress, and improve your bone density.  Over the next year, patient will increase physical activity as tolerated with a goal of at least 150 minutes of moderate physical activity per week.   You can tell that you are exercising at a moderate intensity if your heart starts beating faster and you start breathing faster but can still hold a conversation.  Moderate-intensity exercise ideas include:  Walking 1 mile (1.6 km) in about 15 minutes  Biking  Hiking  Golfing  Dancing  Water aerobics  Patient will verbalize understanding of everyday activities that increase physical activity by providing examples like the following: ? Yard work, such as: ? Pushing a Conservation officer, nature ? Raking and bagging leaves ? Washing your car ? Pushing a stroller ? Shoveling snow ? Gardening ? Washing windows or floors  Patient will be able to explain general safety guidelines for exercising:   Before you start a new exercise program, talk with your health care provider.  Do not exercise so much that you hurt yourself, feel dizzy, or get very short of breath.  Wear comfortable clothes and wear shoes with good support.  Drink plenty of water while you exercise to prevent dehydration or heat stroke.  Work out until your breathing and your heartbeat get faster.         Personalized Health Maintenance & Screening Recommendations  Shingles vaccine  Lung Cancer Screening Recommended: no (Low Dose CT Chest recommended if Age 8-80 years, 30 pack-year currently smoking OR have quit w/in past 15 years) Hepatitis C  Screening recommended: no HIV Screening recommended: no  Advanced Directives: Written information was not prepared per patient's request.  Referrals & Orders No orders of the defined types were placed in this encounter.   Follow-up Plan . Follow-up with Dettinger, Fransisca Kaufmann, MD as planned . Schedule your yearly hearing exam.    I have  personally reviewed and noted the following in the patient's chart:   . Medical and social history . Use of alcohol, tobacco or illicit drugs  . Current medications and supplements . Functional ability and status . Nutritional status . Physical activity . Advanced directives . List of other physicians . Hospitalizations, surgeries, and ER visits in previous 12 months . Vitals . Screenings to include cognitive, depression, and falls . Referrals and appointments  In addition, I have reviewed and discussed with Christian Johnson certain preventive protocols, quality metrics, and best practice recommendations. A written personalized care plan for preventive services as well as general preventive health recommendations is available and can be mailed to the patient at his request.      Lynnea Ferrier, LPN  [id number]

## 2019-12-18 ENCOUNTER — Ambulatory Visit: Payer: Medicare Other | Admitting: Family Medicine

## 2019-12-25 ENCOUNTER — Other Ambulatory Visit: Payer: Self-pay

## 2019-12-25 ENCOUNTER — Encounter: Payer: Self-pay | Admitting: Family Medicine

## 2019-12-25 ENCOUNTER — Ambulatory Visit (INDEPENDENT_AMBULATORY_CARE_PROVIDER_SITE_OTHER): Payer: Medicare Other | Admitting: Family Medicine

## 2019-12-25 VITALS — BP 133/64 | HR 63 | Temp 98.4°F | Ht 73.0 in | Wt 177.5 lb

## 2019-12-25 DIAGNOSIS — F0391 Unspecified dementia with behavioral disturbance: Secondary | ICD-10-CM | POA: Diagnosis not present

## 2019-12-25 MED ORDER — SERTRALINE HCL 50 MG PO TABS: 50.0000 mg | ORAL_TABLET | Freq: Every day | ORAL | 5 refills | Status: DC

## 2019-12-25 NOTE — Progress Notes (Signed)
BP 133/64   Pulse 63   Temp 98.4 F (36.9 C) (Temporal)   Ht 6\' 1"  (1.854 m)   Wt 177 lb 8 oz (80.5 kg)   BMI 23.42 kg/m    Subjective:   Patient ID: Christian Johnson, male    DOB: [birth year], 79 y.o.   MRN: [id number]  HPI: Christian Johnson is a 79 y.o. male presenting on 12/25/2019 for Dementia (pt here today with wife who states pt is getting more forgetful and gets upset when he doesn't remember )   HPI Memory issues and behavioral disturbances Patient is coming in with increasing memory issues, he says that he is forgetting things such as where things are in the house or sometimes up with something in the fridge and then forgets there or sometimes is woodworking tools and finds in place that they have always been.  Per his wife who is with him he is becoming increasingly frustrated with this. MMSE - Mini Mental State Exam 12/25/2019 04/10/2019  Orientation to time 5 5  Orientation to Place 2 3  Registration 3 3  Attention/ Calculation 0 3  Recall 1 0  Language- name 2 objects 2 2  Language- repeat 1 1  Language- follow 3 step command 3 3  Language- read & follow direction 1 1  Write a sentence 1 1  Copy design 1 1  Total score 20 23    Relevant past medical, surgical, family and social history reviewed and updated as indicated. Interim medical history since our last visit reviewed. Allergies and medications reviewed and updated.  Review of Systems  Constitutional: Negative for chills and fever.  Eyes: Negative for visual disturbance.  Respiratory: Negative for shortness of breath and wheezing.   Cardiovascular: Negative for chest pain and leg swelling.  Musculoskeletal: Negative for back pain and gait problem.  Skin: Negative for rash.  Psychiatric/Behavioral: Positive for behavioral problems, confusion and decreased concentration.  All other systems reviewed and are negative.   Per HPI unless specifically indicated above   Allergies as of 12/25/2019   No Known  Allergies     Medication List       Accurate as of December 25, 2019 10:26 AM. If you have any questions, ask your nurse or doctor.        Aleve 220 MG tablet Generic drug: naproxen sodium Take 220 mg by mouth daily as needed (for pain or headache).   atorvastatin 20 MG tablet Commonly known as: LIPITOR Take 1 tablet (20 mg total) by mouth daily.   Fish Oil 500 MG Caps Take 500 mg by mouth daily.   GenTeal Mild 0.2 % Soln Generic drug: Hypromellose Place 1 drop into both eyes daily.   levothyroxine 100 MCG tablet Commonly known as: SYNTHROID Take 1 tablet (100 mcg total) by mouth daily.   memantine 14 MG Cp24 24 hr capsule Commonly known as: NAMENDA XR Take 1 capsule (14 mg total) by mouth daily.   multivitamin with minerals Tabs tablet Take 1 tablet by mouth daily. Centrum Silver   omeprazole 20 MG capsule Commonly known as: PRILOSEC Take 1 capsule (20 mg total) by mouth every morning. What changed: when to take this   sertraline 50 MG tablet Commonly known as: Zoloft Take 1 tablet (50 mg total) by mouth daily. Started by: Fransisca Kaufmann Stevie Charter, MD        Objective:   BP 133/64   Pulse 63   Temp 98.4 F (36.9 C) (Temporal)  Ht 6\' 1"  (1.854 m)   Wt 177 lb 8 oz (80.5 kg)   BMI 23.42 kg/m   Wt Readings from Last 3 Encounters:  12/25/19 177 lb 8 oz (80.5 kg)  07/21/19 175 lb 9.6 oz (79.7 kg)  04/10/19 174 lb (78.9 kg)    Physical Exam Vitals and nursing note reviewed.  Constitutional:      General: He is not in acute distress.    Appearance: He is well-developed. He is not diaphoretic.  Neck:     Thyroid: No thyromegaly.  Musculoskeletal:        General: Normal range of motion.  Skin:    General: Skin is warm and dry.     Findings: No rash.  Neurological:     Mental Status: He is alert and oriented to person, place, and time.     Coordination: Coordination normal.  Psychiatric:        Behavior: Behavior normal.       Assessment & Plan:    Problem List Items Addressed This Visit      Nervous and Auditory   Dementia ([place]) - Primary   Relevant Medications   sertraline (ZOLOFT) 50 MG tablet      Patient's thyroid was off but he already has an appointment to come up in 1 month and wants to wait till then to get that checked.  We will start Zoloft to help with memory.  We will do research on new memory medication that was just FDA approved and see if it is a possibility for the future. Follow up plan: Return in about 1 month (around 01/24/2020), or if symptoms worsen or fail to improve, for Follow-up memory and mood.  Counseling provided for all of the vaccine components No orders of the defined types were placed in this encounter.   Caryl Pina, MD [facility] 12/25/2019, 10:26 AM

## 2019-12-26 ENCOUNTER — Other Ambulatory Visit: Payer: Self-pay | Admitting: Family Medicine

## 2019-12-30 ENCOUNTER — Ambulatory Visit (INDEPENDENT_AMBULATORY_CARE_PROVIDER_SITE_OTHER): Payer: Medicare Other | Admitting: Nurse Practitioner

## 2019-12-30 ENCOUNTER — Encounter: Payer: Self-pay | Admitting: Nurse Practitioner

## 2019-12-30 DIAGNOSIS — R42 Dizziness and giddiness: Secondary | ICD-10-CM

## 2019-12-30 MED ORDER — MECLIZINE HCL 25 MG PO TABS: 25.0000 mg | ORAL_TABLET | Freq: Three times a day (TID) | ORAL | 0 refills | Status: DC | PRN

## 2019-12-30 MED ORDER — ONDANSETRON HCL 4 MG PO TABS: 4.0000 mg | ORAL_TABLET | Freq: Three times a day (TID) | ORAL | 0 refills | Status: DC | PRN

## 2019-12-30 NOTE — Progress Notes (Signed)
Virtual Visit via telephone Note Due to COVID-19 pandemic this visit was conducted virtually. This visit type was conducted due to national recommendations for restrictions regarding the COVID-19 Pandemic (e.g. social distancing, sheltering in place) in an effort to limit this patient's exposure and mitigate transmission in our community. All issues noted in this document were discussed and addressed.  A physical exam was not performed with this format.  I connected with Christian Johnson on 12/30/19 at 10:45 by telephone and verified that I am speaking with the correct person using two identifiers. Christian Johnson is currently located at home and his wife is currently with him during visit. The provider, Mary-Margaret Hassell Done, FNP is located in their office at time of visit.  I discussed the limitations, risks, security and privacy concerns of performing an evaluation and management service by telephone and the availability of in person appointments. I also discussed with the patient that there may be a patient responsible charge related to this service. The patient expressed understanding and agreed to proceed.   History and Present Illness:   Chief Complaint: Nausea   HPI Patient calls in C/O dizziness that started yesterday. He says when he stands up he feels dizzy. Has had some nausea but no vomiting.     Review of Systems  Constitutional: Negative for chills and fever.  HENT: Negative.   Respiratory: Negative.   Cardiovascular: Negative.   Gastrointestinal: Positive for nausea. Negative for vomiting.  Neurological: Positive for dizziness. Negative for headaches.  All other systems reviewed and are negative.    Observations/Objective: Alert and oriented- answers all questions appropriately No distress    Assessment and Plan: Christian Johnson in today with chief complaint of Nausea   1. Vertigo Meds ordered this encounter  Medications   meclizine (ANTIVERT) 25 MG tablet     Sig: Take 1 tablet (25 mg total) by mouth 3 (three) times daily as needed for dizziness.    Dispense:  30 tablet    Refill:  0    Order Specific Question:   Supervising Provider    Answer:   Caryl Pina A [[id number]]   ondansetron (ZOFRAN) 4 MG tablet    Sig: Take 1 tablet (4 mg total) by mouth every 8 (eight) hours as needed for nausea or vomiting.    Dispense:  20 tablet    Refill:  0    Order Specific Question:   Supervising Provider    Answer:   Caryl Pina A [[id number]]   Rise slowly from sitting  To standing Force fluids     Follow Up Instructions: prn    I discussed the assessment and treatment plan with the patient. The patient was provided an opportunity to ask questions and all were answered. The patient agreed with the plan and demonstrated an understanding of the instructions.   The patient was advised to call back or seek an in-person evaluation if the symptoms worsen or if the condition fails to improve as anticipated.  The above assessment and management plan was discussed with the patient. The patient verbalized understanding of and has agreed to the management plan. Patient is aware to call the clinic if symptoms persist or worsen. Patient is aware when to return to the clinic for a follow-up visit. Patient educated on when it is appropriate to go to the emergency department.   Time call ended:  10:56  I provided 15 minutes of non-face-to-face time during this encounter.    Mary-Margaret Hassell Done, FNP

## 2020-01-14 ENCOUNTER — Other Ambulatory Visit: Payer: Self-pay | Admitting: Family Medicine

## 2020-01-16 ENCOUNTER — Other Ambulatory Visit: Payer: Self-pay | Admitting: Family Medicine

## 2020-01-16 DIAGNOSIS — F0391 Unspecified dementia with behavioral disturbance: Secondary | ICD-10-CM

## 2020-01-16 DIAGNOSIS — R413 Other amnesia: Secondary | ICD-10-CM

## 2020-01-20 ENCOUNTER — Encounter: Payer: Self-pay | Admitting: Family Medicine

## 2020-01-20 ENCOUNTER — Other Ambulatory Visit: Payer: Self-pay

## 2020-01-20 ENCOUNTER — Ambulatory Visit (INDEPENDENT_AMBULATORY_CARE_PROVIDER_SITE_OTHER): Payer: Medicare Other | Admitting: Family Medicine

## 2020-01-20 VITALS — BP 152/86 | HR 70 | Temp 98.0°F | Ht 73.0 in | Wt 174.0 lb

## 2020-01-20 DIAGNOSIS — F0391 Unspecified dementia with behavioral disturbance: Secondary | ICD-10-CM

## 2020-01-20 DIAGNOSIS — I951 Orthostatic hypotension: Secondary | ICD-10-CM

## 2020-01-20 DIAGNOSIS — E782 Mixed hyperlipidemia: Secondary | ICD-10-CM | POA: Diagnosis not present

## 2020-01-20 DIAGNOSIS — K219 Gastro-esophageal reflux disease without esophagitis: Secondary | ICD-10-CM

## 2020-01-20 DIAGNOSIS — E785 Hyperlipidemia, unspecified: Secondary | ICD-10-CM | POA: Insufficient documentation

## 2020-01-20 DIAGNOSIS — E039 Hypothyroidism, unspecified: Secondary | ICD-10-CM | POA: Diagnosis not present

## 2020-01-20 DIAGNOSIS — R413 Other amnesia: Secondary | ICD-10-CM

## 2020-01-20 MED ORDER — ATORVASTATIN CALCIUM 20 MG PO TABS: 20.0000 mg | ORAL_TABLET | Freq: Every day | ORAL | 3 refills | Status: DC

## 2020-01-20 MED ORDER — LEVOTHYROXINE SODIUM 100 MCG PO TABS: 100.0000 ug | ORAL_TABLET | Freq: Every day | ORAL | 3 refills | Status: DC

## 2020-01-20 MED ORDER — MEMANTINE HCL ER 14 MG PO CP24: 14.0000 mg | ORAL_CAPSULE | Freq: Every day | ORAL | 3 refills | Status: DC

## 2020-01-20 MED ORDER — SERTRALINE HCL 50 MG PO TABS: 50.0000 mg | ORAL_TABLET | Freq: Every day | ORAL | 3 refills | Status: DC

## 2020-01-20 NOTE — Progress Notes (Signed)
BP (!) 152/86   Pulse 70   Temp 98 F (36.7 C)   Ht _0  (1.854 m)   Wt 174 lb (78.9 kg)   SpO2 98%   BMI 22.96 kg/m    Subjective:   Patient ID: Christian Johnson, male    DOB: Mar 31, 1941, 79 y.o.   MRN: [id number]  HPI: Christian Johnson is a 79 y.o. male presenting on 01/20/2020 for Medical Management of Chronic Issues, Dizziness (comes and goes), and Hypothyroidism   HPI Patient has dementia with behavioral disturbance but cannot recall if he is taking the Namenda or the Zoloft which was to help calm both these down, he cannot remember either of these.  He is not here with family member today.  Patient comes in complaining of orthostatic dizziness, when he bends over and then stands up he gets really dizzy feels faint, he is never fallen because of it, it is never passed out because of it.  He denies any spinning sensation.  The dizziness has been going off and on over the past few months at least that he can recall, it happens a few times a month.  Hypothyroidism recheck Patient is coming in for thyroid recheck today as well. They deny any issues with hair changes or heat or cold problems or diarrhea or constipation. They deny any chest pain or palpitations. They are currently on levothyroxine 122mcrograms   Hyperlipidemia Patient is coming in for recheck of his hyperlipidemia. The patient is currently taking fish oil and atorvastatin. They deny any issues with myalgias or history of liver damage from it. They deny any focal numbness or weakness or chest pain.   GERD Patient is currently on omeprazole.  She denies any major symptoms or abdominal pain or belching or burping. She denies any blood in her stool or lightheadedness or dizziness.   Relevant past medical, surgical, family and social history reviewed and updated as indicated. Interim medical history since our last visit reviewed. Allergies and medications reviewed and updated.  Review of Systems  Constitutional: Negative  for chills and fever.  HENT: Negative for congestion, sinus pressure and sinus pain.   Respiratory: Negative for cough, shortness of breath and wheezing.   Cardiovascular: Negative for chest pain, palpitations and leg swelling.  Musculoskeletal: Negative for back pain and gait problem.  Skin: Negative for rash.  Neurological: Positive for dizziness and light-headedness. Negative for numbness and headaches.  All other systems reviewed and are negative.   Per HPI unless specifically indicated above   Allergies as of 01/20/2020   No Known Allergies     Medication List       Accurate as of January 20, 2020  8:25 AM. If you have any questions, ask your nurse or doctor.        Aleve 220 MG tablet Generic drug: naproxen sodium Take 220 mg by mouth daily as needed (for pain or headache).   atorvastatin 20 MG tablet Commonly known as: LIPITOR Take 1 tablet (20 mg total) by mouth daily.   Fish Oil 500 MG Caps Take 500 mg by mouth daily.   GenTeal Mild 0.2 % Soln Generic drug: Hypromellose Place 1 drop into both eyes daily.   levothyroxine 100 MCG tablet Commonly known as: SYNTHROID Take 1 tablet (100 mcg total) by mouth daily.   meclizine 25 MG tablet Commonly known as: ANTIVERT Take 1 tablet (25 mg total) by mouth 3 (three) times daily as needed for dizziness.   memantine 14  MG Cp24 24 hr capsule Commonly known as: NAMENDA XR Take 1 capsule (14 mg total) by mouth daily.   multivitamin with minerals Tabs tablet Take 1 tablet by mouth daily. Centrum Silver   omeprazole 20 MG capsule Commonly known as: PRILOSEC TAKE 1 CAPSULE BY MOUTH EVERY DAY IN THE MORNING   ondansetron 4 MG tablet Commonly known as: Zofran Take 1 tablet (4 mg total) by mouth every 8 (eight) hours as needed for nausea or vomiting.   sertraline 50 MG tablet Commonly known as: ZOLOFT Take 1 tablet (50 mg total) by mouth daily.        Objective:   BP (!) 152/86   Pulse 70   Temp 98 F (36.7 C)    Ht _0  (1.854 m)   Wt 174 lb (78.9 kg)   SpO2 98%   BMI 22.96 kg/m   Wt Readings from Last 3 Encounters:  01/20/20 174 lb (78.9 kg)  12/25/19 177 lb 8 oz (80.5 kg)  07/21/19 175 lb 9.6 oz (79.7 kg)    Physical Exam Vitals and nursing note reviewed.  Constitutional:      General: He is not in acute distress.    Appearance: He is well-developed. He is not diaphoretic.  Eyes:     General: No scleral icterus.    Conjunctiva/sclera: Conjunctivae normal.  Neck:     Thyroid: No thyromegaly.  Cardiovascular:     Rate and Rhythm: Normal rate and regular rhythm.     Heart sounds: Normal heart sounds. No murmur heard.   Pulmonary:     Effort: Pulmonary effort is normal. No respiratory distress.     Breath sounds: Normal breath sounds. No wheezing.  Musculoskeletal:        General: Normal range of motion.     Cervical back: Neck supple.  Lymphadenopathy:     Cervical: No cervical adenopathy.  Skin:    General: Skin is warm and dry.     Findings: No rash.  Neurological:     Mental Status: He is alert and oriented to person, place, and time.     Coordination: Coordination normal.  Psychiatric:        Behavior: Behavior normal.       Assessment & Plan:   Problem List Items Addressed This Visit      Digestive   GERD (gastroesophageal reflux disease)   Relevant Orders   CBC with Differential/Platelet   CMP14+EGFR     Endocrine   Hypothyroidism - Primary   Relevant Medications   levothyroxine (SYNTHROID) 100 MCG tablet   Other Relevant Orders   TSH     Nervous and Auditory   Dementia ([facility])   Relevant Medications   memantine (NAMENDA XR) 14 MG CP24 24 hr capsule   sertraline (ZOLOFT) 50 MG tablet     Other   Hyperlipidemia   Relevant Medications   atorvastatin (LIPITOR) 20 MG tablet   Other Relevant Orders   Lipid panel    Other Visit Diagnoses    Memory difficulties       Relevant Medications   memantine (NAMENDA XR) 14 MG CP24 24 hr capsule    Orthostatic hypotension       Relevant Medications   atorvastatin (LIPITOR) 20 MG tablet   Other Relevant Orders   ECHOCARDIOGRAM COMPLETE      Patient has been having orthostatic hypotension, he was 149/76 lying and 128/80 standing and then back down to 149/76 lying again.  Will do echocardiogram to make  sure there is no underlying structural damage.  We will do blood work to recheck thyroid, was off on the last check it was post come back sooner likely because of the dementia became a challenge.  Continue current medication. Follow up plan: Return in about 6 months (around 07/22/2020), or if symptoms worsen or fail to improve, for Hypertension and thyroid and cholesterol.  Counseling provided for all of the vaccine components Orders Placed This Encounter  Procedures  . CBC with Differential/Platelet  . CMP14+EGFR  . Lipid panel  . TSH  . ECHOCARDIOGRAM [place] Ned[place], MD [facility] 01/20/2020, 8:25 AM

## 2020-01-21 ENCOUNTER — Ambulatory Visit: Payer: Medicare Other | Admitting: Family Medicine

## 2020-01-21 LAB — CBC WITH DIFFERENTIAL/PLATELET
Basophils Absolute: 0 x10E3/uL (ref 0.0–0.2)
Basos: 1 %
EOS (ABSOLUTE): 0.2 x10E3/uL (ref 0.0–0.4)
Eos: 3 %
Hematocrit: 40.1 % (ref 37.5–51.0)
Hemoglobin: 14.1 g/dL (ref 13.0–17.7)
Immature Grans (Abs): 0 x10E3/uL (ref 0.0–0.1)
Immature Granulocytes: 0 %
Lymphocytes Absolute: 0.9 x10E3/uL (ref 0.7–3.1)
Lymphs: 15 %
MCH: 33.6 pg — ABNORMAL HIGH (ref 26.6–33.0)
MCHC: 35.2 g/dL (ref 31.5–35.7)
MCV: 96 fL (ref 79–97)
Monocytes Absolute: 0.7 x10E3/uL (ref 0.1–0.9)
Monocytes: 11 %
Neutrophils Absolute: 4.1 x10E3/uL (ref 1.4–7.0)
Neutrophils: 70 %
Platelets: 174 x10E3/uL (ref 150–450)
RBC: 4.2 x10E6/uL (ref 4.14–5.80)
RDW: 13 % (ref 11.6–15.4)
WBC: 5.9 x10E3/uL (ref 3.4–10.8)

## 2020-01-21 LAB — CMP14+EGFR
ALT: 15 IU/L (ref 0–44)
AST: 24 IU/L (ref 0–40)
Albumin/Globulin Ratio: 2.2 (ref 1.2–2.2)
Albumin: 4.3 g/dL (ref 3.7–4.7)
Alkaline Phosphatase: 116 IU/L (ref 48–121)
BUN/Creatinine Ratio: 22 (ref 10–24)
BUN: 22 mg/dL (ref 8–27)
Bilirubin Total: 1.1 mg/dL (ref 0.0–1.2)
CO2: 25 mmol/L (ref 20–29)
Calcium: 9.3 mg/dL (ref 8.6–10.2)
Chloride: 104 mmol/L (ref 96–106)
Creatinine, Ser: 1.02 mg/dL (ref 0.76–1.27)
GFR calc Af Amer: 81 mL/min/{1.73_m2} (ref >59)
GFR calc non Af Amer: 70 mL/min/{1.73_m2} (ref >59)
Globulin, Total: 2 g/dL (ref 1.5–4.5)
Glucose: 104 mg/dL — ABNORMAL HIGH (ref 65–99)
Potassium: 4.2 mmol/L (ref 3.5–5.2)
Sodium: 142 mmol/L (ref 134–144)
Total Protein: 6.3 g/dL (ref 6.0–8.5)

## 2020-01-21 LAB — TSH: TSH: 0.551 u[IU]/mL (ref 0.450–4.500)

## 2020-01-21 LAB — LIPID PANEL
Chol/HDL Ratio: 2.6 ratio (ref 0.0–5.0)
Cholesterol, Total: 141 mg/dL (ref 100–199)
HDL: 55 mg/dL (ref >39)
LDL Chol Calc (NIH): 74 mg/dL (ref 0–99)
Triglycerides: 57 mg/dL (ref 0–149)
VLDL Cholesterol Cal: 12 mg/dL (ref 5–40)

## 2020-02-02 ENCOUNTER — Other Ambulatory Visit: Payer: Self-pay

## 2020-02-02 ENCOUNTER — Ambulatory Visit (HOSPITAL_COMMUNITY)
Admission: RE | Admit: 2020-02-02 | Discharge: 2020-02-02 | Disposition: A | Discharge status: Home / Self Care | Payer: Medicare Other | Source: Ambulatory Visit | Attending: Family Medicine | Admitting: Family Medicine

## 2020-02-02 DIAGNOSIS — I951 Orthostatic hypotension: Secondary | ICD-10-CM | POA: Diagnosis not present

## 2020-02-02 LAB — ECHOCARDIOGRAM COMPLETE
AR max vel: 2.74 cm2
AV Area VTI: 2.54 cm2
AV Area mean vel: 2.49 cm2
AV Mean grad: 3.5 mmHg
AV Peak grad: 5.8 mmHg
Ao pk vel: 1.21 m/s
Area-P 1/2: 2.76 cm2
MV M vel: 3.87 m/s
MV Peak grad: 59.9 mmHg
P 1/2 time: 565 msec
S' Lateral: 2.85 cm

## 2020-02-02 NOTE — Progress Notes (Signed)
*  PRELIMINARY RESULTS* Echocardiogram 2D Echocardiogram has been performed.  [name] 02/02/2020, 9:21 AM

## 2020-02-26 ENCOUNTER — Ambulatory Visit: Payer: Medicare Other | Admitting: Family Medicine

## 2020-03-04 ENCOUNTER — Encounter: Payer: Self-pay | Admitting: Family Medicine

## 2020-03-04 ENCOUNTER — Ambulatory Visit (INDEPENDENT_AMBULATORY_CARE_PROVIDER_SITE_OTHER): Payer: Medicare Other | Admitting: Family Medicine

## 2020-03-04 ENCOUNTER — Other Ambulatory Visit: Payer: Self-pay

## 2020-03-04 VITALS — BP 129/68 | HR 71 | Temp 98.2°F | Ht 73.0 in | Wt 175.0 lb

## 2020-03-04 DIAGNOSIS — F0391 Unspecified dementia with behavioral disturbance: Secondary | ICD-10-CM

## 2020-03-04 NOTE — Progress Notes (Signed)
BP (!) 148/75   Pulse 71   Temp 98.2 F (36.8 C)   Ht 6\' 1"  (1.854 m)   Wt 175 lb (79.4 kg)   SpO2 96%   BMI 23.09 kg/m    Subjective:   Patient ID: Christian Johnson, male    DOB: 1941-02-08, 79 y.o.   MRN: [id number]  HPI: Christian Johnson is a 79 y.o. male presenting on 03/04/2020 for Medical Management of Chronic Issues and Dementia   HPI Patient is coming in for memory recheck and anxiety, he was unable to take anxiety medicine because it just made him sleep all the time.  He is currently taking the Namenda and his wife who is here with him feels like maybe it helps a little bit or at least a helps him have less bad days.  She would like to continue it, he maybe has some dizziness when he stands up that may be associated with it but he has not fallen and it is not all the time.  They would like to continue and keep a closer eye on it for now. MMSE - Mini Mental State Exam 12/25/2019 04/10/2019  Orientation to time 5 5  Orientation to Place 2 3  Registration 3 3  Attention/ Calculation 0 3  Recall 1 0  Language- name 2 objects 2 2  Language- repeat 1 1  Language- follow 3 step command 3 3  Language- read & follow direction 1 1  Write a sentence 1 1  Copy design 1 1  Total score 20 23       Relevant past medical, surgical, family and social history reviewed and updated as indicated. Interim medical history since our last visit reviewed. Allergies and medications reviewed and updated.  Review of Systems  Constitutional: Negative for chills and fever.  Respiratory: Negative for shortness of breath and wheezing.   Cardiovascular: Negative for chest pain and leg swelling.  Musculoskeletal: Negative for back pain and gait problem.  Skin: Negative for rash.  Neurological: Positive for dizziness and light-headedness. Negative for weakness and numbness.  All other systems reviewed and are negative.   Per HPI unless specifically indicated above   Allergies as of 03/04/2020   No  Known Allergies     Medication List       Accurate as of March 04, 2020 10:29 AM. If you have any questions, ask your nurse or doctor.        STOP taking these medications   sertraline 50 MG tablet Commonly known as: ZOLOFT Stopped by: Fransisca Kaufmann Marqual Mi, MD     TAKE these medications   Aleve 220 MG tablet Generic drug: naproxen sodium Take 220 mg by mouth daily as needed (for pain or headache).   atorvastatin 20 MG tablet Commonly known as: LIPITOR Take 1 tablet (20 mg total) by mouth daily.   Fish Oil 500 MG Caps Take 500 mg by mouth daily.   GenTeal Mild 0.2 % Soln Generic drug: Hypromellose Place 1 drop into both eyes daily.   levothyroxine 100 MCG tablet Commonly known as: SYNTHROID Take 1 tablet (100 mcg total) by mouth daily.   meclizine 25 MG tablet Commonly known as: ANTIVERT Take 1 tablet (25 mg total) by mouth 3 (three) times daily as needed for dizziness.   memantine 14 MG Cp24 24 hr capsule Commonly known as: NAMENDA XR Take 1 capsule (14 mg total) by mouth daily.   multivitamin with minerals Tabs tablet Take 1 tablet by  mouth daily. Centrum Silver   omeprazole 20 MG capsule Commonly known as: PRILOSEC TAKE 1 CAPSULE BY MOUTH EVERY DAY IN THE MORNING   ondansetron 4 MG tablet Commonly known as: Zofran Take 1 tablet (4 mg total) by mouth every 8 (eight) hours as needed for nausea or vomiting.        Objective:   BP (!) 148/75   Pulse 71   Temp 98.2 F (36.8 C)   Ht 6\' 1"  (1.854 m)   Wt 175 lb (79.4 kg)   SpO2 96%   BMI 23.09 kg/m   Wt Readings from Last 3 Encounters:  03/04/20 175 lb (79.4 kg)  01/20/20 174 lb (78.9 kg)  12/25/19 177 lb 8 oz (80.5 kg)    Physical Exam Vitals and nursing note reviewed.  Constitutional:      General: He is not in acute distress.    Appearance: He is well-developed. He is not diaphoretic.  Eyes:     General: No scleral icterus.    Conjunctiva/sclera: Conjunctivae normal.  Neck:     Thyroid:  No thyromegaly.  Cardiovascular:     Rate and Rhythm: Normal rate and regular rhythm.     Heart sounds: Normal heart sounds. No murmur heard.   Pulmonary:     Effort: Pulmonary effort is normal. No respiratory distress.     Breath sounds: Normal breath sounds. No wheezing.  Musculoskeletal:        General: Normal range of motion.     Cervical back: Neck supple.  Lymphadenopathy:     Cervical: No cervical adenopathy.  Skin:    General: Skin is warm and dry.     Findings: No rash.  Neurological:     Mental Status: He is alert and oriented to person, place, and time.     Coordination: Coordination normal.  Psychiatric:        Behavior: Behavior normal.       Assessment & Plan:   Problem List Items Addressed This Visit      Nervous and Auditory   Dementia ([place]) - Primary      Continue Namenda and we will see how he does over the next little bit. Follow up plan: Return if symptoms worsen or fail to improve.  Counseling provided for all of the vaccine components No orders of the defined types were placed in this encounter.   Caryl Pina, MD [facility] 03/04/2020, 10:29 AM

## 2020-04-20 ENCOUNTER — Other Ambulatory Visit: Payer: Self-pay

## 2020-04-20 ENCOUNTER — Ambulatory Visit (INDEPENDENT_AMBULATORY_CARE_PROVIDER_SITE_OTHER): Payer: Medicare Other

## 2020-04-20 DIAGNOSIS — Z23 Encounter for immunization: Secondary | ICD-10-CM | POA: Diagnosis not present

## 2020-07-25 ENCOUNTER — Encounter: Payer: Self-pay | Admitting: Family Medicine

## 2020-07-25 ENCOUNTER — Ambulatory Visit (INDEPENDENT_AMBULATORY_CARE_PROVIDER_SITE_OTHER): Payer: Medicare Other | Admitting: Family Medicine

## 2020-07-25 ENCOUNTER — Other Ambulatory Visit: Payer: Self-pay

## 2020-07-25 VITALS — BP 146/69 | HR 72 | Ht 73.0 in | Wt 178.0 lb

## 2020-07-25 DIAGNOSIS — K219 Gastro-esophageal reflux disease without esophagitis: Secondary | ICD-10-CM | POA: Diagnosis not present

## 2020-07-25 DIAGNOSIS — E039 Hypothyroidism, unspecified: Secondary | ICD-10-CM

## 2020-07-25 DIAGNOSIS — E782 Mixed hyperlipidemia: Secondary | ICD-10-CM | POA: Diagnosis not present

## 2020-07-25 DIAGNOSIS — Z8546 Personal history of malignant neoplasm of prostate: Secondary | ICD-10-CM

## 2020-07-25 NOTE — Progress Notes (Signed)
BP (!) 146/69   Pulse 72   Ht '6\' 1"'  (1.854 m)   Wt 178 lb (80.7 kg)   SpO2 98%   BMI 23.48 kg/m    Subjective:   Patient ID: Christian Johnson, male    DOB: [birth year], 80 y.o.   MRN: [id number]  HPI: Christian Johnson is a 80 y.o. male presenting on 07/25/2020 for Medical Management of Chronic Issues, Hypothyroidism, and Dementia   HPI Hypothyroidism recheck Patient is coming in for thyroid recheck today as well. They deny any issues with hair changes or heat or cold problems or diarrhea or constipation. They deny any chest pain or palpitations. They are currently on levothyroxine 100 micrograms   Hyperlipidemia Patient is coming in for recheck of his hyperlipidemia. The patient is currently taking fish oils and Lipitor. They deny any issues with myalgias or history of liver damage from it. They deny any focal numbness or weakness or chest pain.   GERD Patient is currently on omeprazole.  She denies any major symptoms or abdominal pain or belching or burping. She denies any blood in her stool or lightheadedness or dizziness.   History of prostate cancer Patient is coming in for recheck on BPH Symptoms: None currently Medication: None currently Last PSA: 1 year ago, normal, recheck today  Dementia is slightly worsening and is on namenda and it seems to be helping some.   Relevant past medical, surgical, family and social history reviewed and updated as indicated. Interim medical history since our last visit reviewed. Allergies and medications reviewed and updated.  Review of Systems  Constitutional: Negative for chills and fever.  Eyes: Negative for visual disturbance.  Respiratory: Negative for shortness of breath and wheezing.   Cardiovascular: Negative for chest pain and leg swelling.  Skin: Negative for rash.  Neurological: Negative for dizziness, weakness and numbness.  Psychiatric/Behavioral: Positive for confusion and decreased concentration. Negative for dysphoric mood,  self-injury, sleep disturbance and suicidal ideas. The patient is not nervous/anxious.   All other systems reviewed and are negative.   Per HPI unless specifically indicated above   Allergies as of 07/25/2020   No Known Allergies     Medication List       Accurate as of July 25, 2020  8:08 AM. If you have any questions, ask your nurse or doctor.        STOP taking these medications   meclizine 25 MG tablet Commonly known as: ANTIVERT Stopped by: Fransisca Kaufmann Tyresse Jayson, MD   ondansetron 4 MG tablet Commonly known as: Zofran Stopped by: Fransisca Kaufmann Yaritzy Huser, MD     TAKE these medications   atorvastatin 20 MG tablet Commonly known as: LIPITOR Take 1 tablet (20 mg total) by mouth daily.   Fish Oil 500 MG Caps Take 500 mg by mouth daily.   GenTeal Mild 0.2 % Soln Generic drug: Hypromellose Place 1 drop into both eyes daily.   levothyroxine 100 MCG tablet Commonly known as: SYNTHROID Take 1 tablet (100 mcg total) by mouth daily.   memantine 14 MG Cp24 24 hr capsule Commonly known as: NAMENDA XR Take 1 capsule (14 mg total) by mouth daily.   multivitamin with minerals Tabs tablet Take 1 tablet by mouth daily. Centrum Silver   naproxen sodium 220 MG tablet Commonly known as: ALEVE Take 220 mg by mouth daily as needed (for pain or headache).   omeprazole 20 MG capsule Commonly known as: PRILOSEC TAKE 1 CAPSULE BY MOUTH EVERY DAY IN THE  MORNING        Objective:   BP (!) 146/69   Pulse 72   Ht '6\' 1"'  (1.854 m)   Wt 178 lb (80.7 kg)   SpO2 98%   BMI 23.48 kg/m   Wt Readings from Last 3 Encounters:  07/25/20 178 lb (80.7 kg)  03/04/20 175 lb (79.4 kg)  01/20/20 174 lb (78.9 kg)    Physical Exam Vitals and nursing note reviewed.  Constitutional:      General: He is not in acute distress.    Appearance: He is well-developed and well-nourished. He is not diaphoretic.  Eyes:     General: No scleral icterus.    Extraocular Movements: EOM normal.      Conjunctiva/sclera: Conjunctivae normal.  Neck:     Thyroid: No thyromegaly.  Cardiovascular:     Rate and Rhythm: Normal rate and regular rhythm.     Pulses: Intact distal pulses.     Heart sounds: Normal heart sounds. No murmur heard.   Pulmonary:     Effort: Pulmonary effort is normal. No respiratory distress.     Breath sounds: Normal breath sounds. No wheezing.  Musculoskeletal:        General: No edema. Normal range of motion.     Cervical back: Neck supple.  Lymphadenopathy:     Cervical: No cervical adenopathy.  Skin:    General: Skin is warm and dry.     Findings: No rash.  Neurological:     Mental Status: He is alert and oriented to person, place, and time.     Coordination: Coordination normal.  Psychiatric:        Mood and Affect: Mood and affect normal.        Behavior: Behavior normal.       Assessment & Plan:   Problem List Items Addressed This Visit      Digestive   GERD (gastroesophageal reflux disease)   Relevant Orders   CBC with Differential/Platelet   CMP14+EGFR     Endocrine   Hypothyroidism - Primary   Relevant Orders   TSH     Other   Hyperlipidemia   Relevant Orders   CMP14+EGFR   Lipid panel   History of prostate cancer   Relevant Orders   PSA, total and free      Doing well and continue current medications,  Follow up plan: Return in about 6 months (around 01/22/2021), or if symptoms worsen or fail to improve, for Thyroid and hyperlipidemia.  Counseling provided for all of the vaccine components No orders of the defined types were placed in this encounter.   Caryl Pina, MD [facility] 07/25/2020, 8:08 AM

## 2020-07-26 LAB — CMP14+EGFR
ALT: 14 IU/L (ref 0–44)
AST: 26 IU/L (ref 0–40)
Albumin/Globulin Ratio: 2 (ref 1.2–2.2)
Albumin: 4 g/dL (ref 3.7–4.7)
Alkaline Phosphatase: 114 IU/L (ref 44–121)
BUN/Creatinine Ratio: 16 (ref 10–24)
BUN: 16 mg/dL (ref 8–27)
Bilirubin Total: 1.4 mg/dL — ABNORMAL HIGH (ref 0.0–1.2)
CO2: 27 mmol/L (ref 20–29)
Calcium: 9.2 mg/dL (ref 8.6–10.2)
Chloride: 102 mmol/L (ref 96–106)
Creatinine, Ser: 0.98 mg/dL (ref 0.76–1.27)
GFR calc Af Amer: 85 mL/min/1.73
GFR calc non Af Amer: 74 mL/min/1.73
Globulin, Total: 2 g/dL (ref 1.5–4.5)
Glucose: 94 mg/dL (ref 65–99)
Potassium: 3.9 mmol/L (ref 3.5–5.2)
Sodium: 141 mmol/L (ref 134–144)
Total Protein: 6 g/dL (ref 6.0–8.5)

## 2020-07-26 LAB — PSA, TOTAL AND FREE
PSA, Free Pct: 10 %
PSA, Free: 0.07 ng/mL
Prostate Specific Ag, Serum: 0.7 ng/mL (ref 0.0–4.0)

## 2020-07-26 LAB — CBC WITH DIFFERENTIAL/PLATELET
Basophils Absolute: 0 10*3/uL (ref 0.0–0.2)
Basos: 0 %
EOS (ABSOLUTE): 0.1 10*3/uL (ref 0.0–0.4)
Eos: 2 %
Hematocrit: 41.3 % (ref 37.5–51.0)
Hemoglobin: 14.3 g/dL (ref 13.0–17.7)
Immature Grans (Abs): 0 10*3/uL (ref 0.0–0.1)
Immature Granulocytes: 0 %
Lymphocytes Absolute: 0.7 10*3/uL (ref 0.7–3.1)
Lymphs: 8 %
MCH: 32.8 pg (ref 26.6–33.0)
MCHC: 34.6 g/dL (ref 31.5–35.7)
MCV: 95 fL (ref 79–97)
Monocytes Absolute: 0.8 10*3/uL (ref 0.1–0.9)
Monocytes: 9 %
Neutrophils Absolute: 6.9 10*3/uL (ref 1.4–7.0)
Neutrophils: 81 %
Platelets: 171 10*3/uL (ref 150–450)
RBC: 4.36 x10E6/uL (ref 4.14–5.80)
RDW: 12.6 % (ref 11.6–15.4)
WBC: 8.5 10*3/uL (ref 3.4–10.8)

## 2020-07-26 LAB — LIPID PANEL
Chol/HDL Ratio: 2.2 ratio (ref 0.0–5.0)
Cholesterol, Total: 121 mg/dL (ref 100–199)
HDL: 55 mg/dL
LDL Chol Calc (NIH): 51 mg/dL (ref 0–99)
Triglycerides: 71 mg/dL (ref 0–149)
VLDL Cholesterol Cal: 15 mg/dL (ref 5–40)

## 2020-07-26 LAB — TSH: TSH: 5.42 u[IU]/mL — ABNORMAL HIGH (ref 0.450–4.500)

## 2020-08-02 ENCOUNTER — Other Ambulatory Visit: Payer: Self-pay

## 2020-08-02 MED ORDER — LEVOTHYROXINE SODIUM 112 MCG PO TABS: 112.0000 ug | ORAL_TABLET | Freq: Every day | ORAL | 1 refills | Status: DC

## 2020-08-22 ENCOUNTER — Ambulatory Visit (INDEPENDENT_AMBULATORY_CARE_PROVIDER_SITE_OTHER): Payer: Medicare Other

## 2020-08-22 DIAGNOSIS — Z0000 Encounter for general adult medical examination without abnormal findings: Secondary | ICD-10-CM | POA: Diagnosis not present

## 2020-08-22 NOTE — Patient Instructions (Signed)
[place] Maintenance Summary and Written Plan of Care  Christian Johnson ,  Thank you for allowing me to perform your Medicare Annual Wellness Visit and for your ongoing commitment to your health.   Health Maintenance & Immunization History Health Maintenance  Topic Date Due  . COVID-19 Vaccine (3 - Moderna risk 4-dose series) 10/24/2020 (Originally 12/09/2019)  . TETANUS/TDAP  12/31/2020  . COLONOSCOPY (Pts 45-62yrs Insurance coverage will need to be confirmed)  11/07/2022  . INFLUENZA VACCINE  Completed  . PNA vac Low Risk Adult  Completed  . Hepatitis C Screening  Discontinued   Immunization History  Administered Date(s) Administered  . Fluad Quad(high Dose 65+) 04/10/2019, 04/20/2020  . Influenza, High Dose Seasonal PF 03/17/2015, 04/18/2016, 04/23/2017, 04/15/2018  . Influenza-Unspecified 04/18/2016, 04/23/2017, 04/15/2018  . Moderna Sars-Covid-2 Vaccination 10/14/2019, 11/11/2019  . Pneumococcal Conjugate-13 07/27/2015  . Pneumococcal Polysaccharide-23 12/19/2016  . Tdap 01/01/2011    These are the patient goals that we discussed: Goals Addressed            This Visit's Progress   . AWV   On track    08/20/2019 AWV Goal: Fall Prevention  . Over the next year, patient will decrease their risk for falls by: o Using assistive devices, such as a cane or walker, as needed o Identifying fall risks within their home and correcting them by: - Removing throw rugs - Adding handrails to stairs or ramps - Removing clutter and keeping a clear pathway throughout the home - Increasing light, especially at night - Adding shower handles/bars - Raising toilet seat o Identifying potential personal risk factors for falls: - Medication side effects - Incontinence/urgency - Vestibular dysfunction - Hearing loss - Musculoskeletal disorders - Neurological disorders - Orthostatic hypotension  08/20/2019 AWV Goal: Exercise for General Health   Patient will  verbalize understanding of the benefits of increased physical activity:  Exercising regularly is important. It will improve your overall fitness, flexibility, and endurance.  Regular exercise also will improve your overall health. It can help you control your weight, reduce stress, and improve your bone density.  Over the next year, patient will increase physical activity as tolerated with a goal of at least 150 minutes of moderate physical activity per week.   You can tell that you are exercising at a moderate intensity if your heart starts beating faster and you start breathing faster but can still hold a conversation.  Moderate-intensity exercise ideas include:  Walking 1 mile (1.6 km) in about 15 minutes  Biking  Hiking  Golfing  Dancing  Water aerobics  Patient will verbalize understanding of everyday activities that increase physical activity by providing examples like the following: ? Yard work, such as: ? Pushing a Conservation officer, nature ? Raking and bagging leaves ? Washing your car ? Pushing a stroller ? Shoveling snow ? Gardening ? Washing windows or floors  Patient will be able to explain general safety guidelines for exercising:   Before you start a new exercise program, talk with your health care provider.  Do not exercise so much that you hurt yourself, feel dizzy, or get very short of breath.  Wear comfortable clothes and wear shoes with good support.  Drink plenty of water while you exercise to prevent dehydration or heat stroke.  Work out until your breathing and your heartbeat get faster.       . Exercise 150 min/wk Moderate Activity   Not on track       This is  a list of Health Maintenance Items that are overdue or due now: There are no preventive care reminders to display for this patient.   Orders/Referrals Placed Today: No orders of the defined types were placed in this encounter.  (Contact our referral department at [phone] if you have not  spoken with someone about your referral appointment within the next 5 days)    Follow-up Plan  Scheduled with Dr. Warrick Parisian on 10/31/2020 at 1:55pm.

## 2020-08-22 NOTE — Progress Notes (Signed)
MEDICARE ANNUAL WELLNESS VISIT  08/22/2020  Telephone Visit Disclaimer This Medicare AWV was conducted by telephone due to national recommendations for restrictions regarding the COVID-19 Pandemic (e.g. social distancing).  I verified, using two identifiers, that I am speaking with Christian Johnson or their authorized healthcare agent. I discussed the limitations, risks, security, and privacy concerns of performing an evaluation and management service by telephone and the potential availability of an in-person appointment in the future. The patient expressed understanding and agreed to proceed.  Location of Patient: Home Location of Provider (nurse):  [place].  Subjective:    Christian Johnson is a 80 y.o. male patient of Dettinger, Fransisca Kaufmann, MD who had a Medicare Annual Wellness Visit today via telephone. Admir is Retired and lives with their spouse. he has two children. he reports that he is socially active and does interact with friends/family regularly. he is not physically active and enjoys woodworking.  Patient Care Team: Dettinger, Fransisca Kaufmann, MD as PCP - General (Family Medicine)  Advanced Directives 08/22/2020 08/20/2019 03/09/2019 02/24/2019 11/06/2017 01/31/2012  Does Patient Have a Medical Advance Directive? No No No No Yes Patient has advance directive, copy not in chart  Type of Advance Directive - - - - Healthcare Power of [place];Living will Living will  Copy of [place] in Chart? - - - - No - copy requested -  Would patient like information on creating a medical advance directive? No - Patient declined No - Patient declined No - Patient declined No - Patient declined - -  Pre-existing out of facility DNR order (yellow form or pink MOST form) - - - - - No    Hospital Utilization Over the Past 12 Months: # of hospitalizations or ER visits: 0 # of surgeries: 0  Review of Systems    Patient reports that his overall health is unchanged  compared to last year.  Patient Reported Readings (BP, Pulse, CBG, Weight, etc) none  Pain Assessment Pain : No/denies pain     Current Medications & Allergies (verified) Allergies as of 08/22/2020   No Known Allergies     Medication List       Accurate as of August 22, 2020  8:23 AM. If you have any questions, ask your nurse or doctor.        atorvastatin 20 MG tablet Commonly known as: LIPITOR Take 1 tablet (20 mg total) by mouth daily.   Fish Oil 500 MG Caps Take 500 mg by mouth daily.   GenTeal Mild 0.2 % Soln Generic drug: Hypromellose Place 1 drop into both eyes daily.   levothyroxine 112 MCG tablet Commonly known as: SYNTHROID Take 1 tablet (112 mcg total) by mouth daily.   memantine 14 MG Cp24 24 hr capsule Commonly known as: NAMENDA XR Take 1 capsule (14 mg total) by mouth daily.   multivitamin with minerals Tabs tablet Take 1 tablet by mouth daily. Centrum Silver   naproxen sodium 220 MG tablet Commonly known as: ALEVE Take 220 mg by mouth daily as needed (for pain or headache).   omeprazole 20 MG capsule Commonly known as: PRILOSEC TAKE 1 CAPSULE BY MOUTH EVERY DAY IN THE MORNING       History (reviewed): Past Medical History:  Diagnosis Date  . Cancer [facility])    Prostate  . Cataract   . Colon polyps   . GERD (gastroesophageal reflux disease)   . Hyperlipidemia   . Hypothyroidism   .  Memory change   . Prostate cancer [facility](HCC)    Past Surgical History:  Procedure Laterality Date  . CATARACT EXTRACTION W/PHACO Left 03/09/2019   Procedure: CATARACT EXTRACTION PHACO AND INTRAOCULAR LENS PLACEMENT (IOC);  Surgeon: Fabio PierceWrzosek, James, MD;  Location: [facility];  Service: Ophthalmology;  Laterality: Left;  left, CDE: 7.70  . COLONOSCOPY  01/31/2012   Procedure: COLONOSCOPY;  Surgeon: Malissa HippoNajeeb U Rehman, MD;  Location: [facility];  Service: Endoscopy;  Laterality: N/A;  930  . COLONOSCOPY N/A 11/06/2017   Procedure: COLONOSCOPY;  Surgeon: Malissa Hippoehman, Najeeb U,  MD;  Location: [facility];  Service: Endoscopy;  Laterality: N/A;  830  . Colonoscopy with polyp removal    . EYE SURGERY Left 07/08/2018   cataracts  . POLYPECTOMY  11/06/2017   Procedure: POLYPECTOMY;  Surgeon: Malissa Hippoehman, Najeeb U, MD;  Location: [facility];  Service: Endoscopy;;  cecal (CB), transverse colon (CB), rectal polyp (HS)  . Right hip core decompression     Family History  Problem Relation Age of Onset  . Breast cancer Mother   . Cancer Father        Lung  . Hypertension Son   . Kidney disease Son   . Kidney cancer Son   . Colon cancer Neg Hx    Social History   Socioeconomic History  . Marital status: Married    Spouse name: Bonita QuinLinda  . Number of children: 2  . Years of education: 1912  . Highest education level: High school graduate  Occupational History  . 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 Smoking status: Former Smoker    Packs/day: 1.00    Years: 25.00    Pack years: 25.00    Types: Cigarettes    Quit date: 02/24/1999    Years since quitting: 21.5  . Smokeless tobacco: Never Used  Vaping Use  . Vaping Use: Never used  Substance and Sexual Activity  . Alcohol use: No  . Drug use: No  . Sexual activity: Not Currently  Other Topics Concern  . Not on file  Social History Narrative  . Not on file   Social Determinants of Health   Financial Resource Strain: Not on file  Food Insecurity: Not on file  Transportation Needs: Not on file  Physical Activity: Not on file  Stress: Not on file  Social Connections: Not on file    Activities of Daily Living In your present state of health, do you have any difficulty performing the following activities: 08/22/2020  Hearing? Y  Vision? N  Difficulty concentrating or making decisions? N  Walking or climbing stairs? N  Dressing or bathing? N  Doing errands, shopping? N  Preparing Food and eating ? N  Using the Toilet? N  In the past six months, have you accidently leaked urine? N   Do you have problems with loss of bowel control? N  Managing your Medications? N  Managing your Finances? N  Housekeeping or managing your Housekeeping? N  Some recent data might be hidden    Patient Education/ Literacy How often do you need to have someone help you when you read instructions, pamphlets, or other written materials from your doctor or pharmacy?: 3 - Sometimes What is the last grade level you completed in school?: 12  Exercise Current Exercise Habits: The patient does not participate in regular exercise at present, Exercise limited by: None identified  Diet Patient reports consuming 3 meals a day and  2 snack(s) a day Patient reports that his primary diet is: Regular Patient reports that she does have regular access to food.   Depression Screen PHQ 2/9 Scores 08/22/2020 07/25/2020 03/04/2020 01/20/2020 12/25/2019 08/20/2019 08/20/2019  PHQ - 2 Score 1 1 0 1 1 1 1   PHQ- 9 Score - - - - - 3 -     Fall Risk Fall Risk  08/22/2020 07/25/2020 03/04/2020 01/20/2020 12/25/2019  Falls in the past year? 0 0 0 0 0     Objective:  Christian Johnson seemed alert and oriented and he participated appropriately during our telephone visit.  Blood Pressure Weight BMI  BP Readings from Last 3 Encounters:  07/25/20 (!) 146/69  03/04/20 129/68  01/20/20 (!) 152/86   Wt Readings from Last 3 Encounters:  07/25/20 178 lb (80.7 kg)  03/04/20 175 lb (79.4 kg)  01/20/20 174 lb (78.9 kg)   BMI Readings from Last 1 Encounters:  07/25/20 23.48 kg/m    *Unable to obtain current vital signs, weight, and BMI due to telephone visit type  Hearing/Vision  . Terrance did  seem to have difficulty with hearing/understanding during the telephone conversation . Reports that he has had a formal eye exam by an eye care professional within the past year . Reports that he has not had a formal hearing evaluation within the past year *Unable to fully assess hearing and vision during telephone visit type  Cognitive  Function: 6CIT Screen 08/22/2020 08/20/2019 02/24/2019  What Year? 0 points 0 points 0 points  What month? 0 points 0 points 0 points  What time? 0 points 0 points 0 points  Count back from 20 0 points 0 points 0 points  Months in reverse 4 points 4 points 4 points  Repeat phrase 10 points 6 points 4 points  Total Score 14 10 8    (Normal:0-7, Significant for Dysfunction: >8)  Normal Cognitive Function Screening: No: 14   Immunization & Health Maintenance Record Immunization History  Administered Date(s) Administered  . Fluad Quad(high Dose 65+) 04/10/2019, 04/20/2020  . Influenza, High Dose Seasonal PF 03/17/2015, 04/18/2016, 04/23/2017, 04/15/2018  . Influenza-Unspecified 04/18/2016, 04/23/2017, 04/15/2018  . Moderna Sars-Covid-2 Vaccination 10/14/2019, 11/11/2019  . Pneumococcal Conjugate-13 07/27/2015  . Pneumococcal Polysaccharide-23 12/19/2016  . Tdap 01/01/2011    Health Maintenance  Topic Date Due  . COVID-19 Vaccine (3 - Moderna risk 4-dose series) 10/24/2020 (Originally 12/09/2019)  . TETANUS/TDAP  12/31/2020  . COLONOSCOPY (Pts 45-48yrs Insurance coverage will need to be confirmed)  11/07/2022  . INFLUENZA VACCINE  Completed  . PNA vac Low Risk Adult  Completed  . Hepatitis C Screening  Discontinued       Assessment  This is a routine wellness examination for Christian Johnson.  Health Maintenance: Due or Overdue There are no preventive care reminders to display for this patient.  Christian Johnson does not need a referral for Community Assistance: Care Management:   no Social Work:    no Prescription Assistance:  no Nutrition/Diabetes Education:  no   Plan:  Personalized Goals Goals Addressed            This Visit's Progress   . AWV   On track    08/20/2019 AWV Goal: Fall Prevention  . Over the next year, patient will decrease their risk for falls by: o Using assistive devices, such as a cane or walker, as needed o Identifying fall risks within their home  and correcting them by: - Removing throw rugs -  Adding handrails to stairs or ramps - Removing clutter and keeping a clear pathway throughout the home - Increasing light, especially at night - Adding shower handles/bars - Raising toilet seat o Identifying potential personal risk factors for falls: - Medication side effects - Incontinence/urgency - Vestibular dysfunction - Hearing loss - Musculoskeletal disorders - Neurological disorders - Orthostatic hypotension  08/20/2019 AWV Goal: Exercise for General Health   Patient will verbalize understanding of the benefits of increased physical activity:  Exercising regularly is important. It will improve your overall fitness, flexibility, and endurance.  Regular exercise also will improve your overall health. It can help you control your weight, reduce stress, and improve your bone density.  Over the next year, patient will increase physical activity as tolerated with a goal of at least 150 minutes of moderate physical activity per week.   You can tell that you are exercising at a moderate intensity if your heart starts beating faster and you start breathing faster but can still hold a conversation.  Moderate-intensity exercise ideas include:  Walking 1 mile (1.6 km) in about 15 minutes  Biking  Hiking  Golfing  Dancing  Water aerobics  Patient will verbalize understanding of everyday activities that increase physical activity by providing examples like the following: ? Yard work, such as: ? Pushing a Conservation officer, nature ? Raking and bagging leaves ? Washing your car ? Pushing a stroller ? Shoveling snow ? Gardening ? Washing windows or floors  Patient will be able to explain general safety guidelines for exercising:   Before you start a new exercise program, talk with your health care provider.  Do not exercise so much that you hurt yourself, feel dizzy, or get very short of breath.  Wear comfortable clothes and wear shoes  with good support.  Drink plenty of water while you exercise to prevent dehydration or heat stroke.  Work out until your breathing and your heartbeat get faster.       . Exercise 150 min/wk Moderate Activity   Not on track     Personalized Health Maintenance & Screening Recommendations   Lung Cancer Screening Recommended: no (Low Dose CT Chest recommended if Age 41-80 years, 30 pack-year currently smoking OR have quit w/in past 15 years) Hepatitis C Screening recommended: no HIV Screening recommended: no  Advanced Directives: Written information was not prepared per patient's request.  Referrals & Orders No orders of the defined types were placed in this encounter.   Follow-up Plan . Follow-up with Dettinger, Fransisca Kaufmann, MD as planned . Schedule 10/31/20    I have personally reviewed and noted the following in the patient's chart:   . Medical and social history . Use of alcohol, tobacco or illicit drugs  . Current medications and supplements . Functional ability and status . Nutritional status . Physical activity . Advanced directives . List of other physicians . Hospitalizations, surgeries, and ER visits in previous 12 months . Vitals . Screenings to include cognitive, depression, and falls . Referrals and appointments  In addition, I have reviewed and discussed with Christian Johnson certain preventive protocols, quality metrics, and best practice recommendations. A written personalized care plan for preventive services as well as general preventive health recommendations is available and can be mailed to the patient at his request.      Maud Deed [employer]  10/17/345

## 2020-08-26 ENCOUNTER — Encounter: Payer: Self-pay | Admitting: Nurse Practitioner

## 2020-08-26 ENCOUNTER — Other Ambulatory Visit: Payer: Self-pay

## 2020-08-26 ENCOUNTER — Ambulatory Visit (INDEPENDENT_AMBULATORY_CARE_PROVIDER_SITE_OTHER): Payer: Medicare Other | Admitting: Nurse Practitioner

## 2020-08-26 VITALS — BP 140/72 | Temp 97.7°F | Ht 73.0 in | Wt 173.6 lb

## 2020-08-26 DIAGNOSIS — R42 Dizziness and giddiness: Secondary | ICD-10-CM | POA: Diagnosis not present

## 2020-08-26 MED ORDER — MECLIZINE HCL 12.5 MG PO TABS: 12.5000 mg | ORAL_TABLET | Freq: Three times a day (TID) | ORAL | 0 refills | Status: DC | PRN

## 2020-08-26 NOTE — Patient Instructions (Signed)
Number Vertigo Vertigo is the feeling that you or the things around you are moving when they are not. This feeling can come and go at any time. Vertigo often goes away on its own. This condition can be dangerous if it happens when you are doing activities like driving or working with machines. Your doctor will do tests to find the cause of your vertigo. These tests will also help your doctor decide on the best treatment for you. Follow these instructions at home: Eating and drinking  Drink enough fluid to keep your pee (urine) pale yellow.  Do not drink alcohol.      Activity  Return to your normal activities as told by your doctor. Ask your doctor what activities are safe for you.  In the morning, first sit up on the side of the bed. When you feel okay, stand slowly while you hold onto something until you know that your balance is fine.  Move slowly. Avoid sudden body or head movements or certain positions, as told by your doctor.  Use a cane if you have trouble standing or walking.  Sit down right away if you feel dizzy.  Avoid doing any tasks or activities that can cause danger to you or others if you get dizzy.  Avoid bending down if you feel dizzy. Place items in your home so that they are easy for you to reach without leaning over.  Do not drive or use heavy machinery if you feel dizzy. General instructions  Take over-the-counter and prescription medicines only as told by your doctor.  Keep all follow-up visits as told by your doctor. This is important. Contact a doctor if:  Your medicine does not help your vertigo.  You have a fever.  Your problems get worse or you have new symptoms.  Your family or friends see changes in your behavior.  The feeling of being sick to your stomach gets worse.  Your vomiting gets worse.  You lose feeling (have numbness) in part of your body.  You feel prickling and tingling in a part of your body. Get help right away if:  You  have trouble moving or talking.  You are always dizzy.  You pass out (faint).  You get very bad headaches.  You feel weak in your hands, arms, or legs.  You have changes in your hearing.  You have changes in how you see (vision).  You get a stiff neck.  Bright light starts to bother you. Summary  Vertigo is the feeling that you or the things around you are moving when they are not.  Your doctor will do tests to find the cause of your vertigo.  You may be told to avoid some tasks, positions, or movements.  Contact a doctor if your medicine is not helping, or if you have a fever, new symptoms, or a change in behavior.  Get help right away if you get very bad headaches, or if you have changes in how you speak, hear, or see. This information is not intended to replace advice given to you by your health care provider. Make sure you discuss any questions you have with your health care provider. Document Revised: 05/26/2018 Document Reviewed: 05/26/2018 Elsevier Patient Education  2021 [employer].

## 2020-08-26 NOTE — Progress Notes (Signed)
Acute Office Visit  Subjective:    Patient ID: Christian Johnson, male    DOB: 01-08-1941, 80 y.o.   MRN: [id number]  Chief Complaint  Patient presents with  . Dizziness    Dizziness This is a new problem. The current episode started yesterday. The problem occurs intermittently. The problem has been unchanged. Pertinent negatives include no headaches. Nothing aggravates the symptoms. He has tried nothing for the symptoms.      Past Surgical History:  Procedure Laterality Date  . CATARACT EXTRACTION W/PHACO Left 03/09/2019   Procedure: CATARACT EXTRACTION PHACO AND INTRAOCULAR LENS PLACEMENT (IOC);  Surgeon: Baruch Goldmann, MD;  Location: [facility];  Service: Ophthalmology;  Laterality: Left;  left, CDE: 7.70  . COLONOSCOPY  01/31/2012   Procedure: COLONOSCOPY;  Surgeon: Rogene Houston, MD;  Location: [facility];  Service: Endoscopy;  Laterality: N/A;  930  . COLONOSCOPY N/A 11/06/2017   Procedure: COLONOSCOPY;  Surgeon: Rogene Houston, MD;  Location: [facility];  Service: Endoscopy;  Laterality: N/A;  830  . Colonoscopy with polyp removal    . EYE SURGERY Left 07/08/2018   cataracts  . POLYPECTOMY  11/06/2017   Procedure: POLYPECTOMY;  Surgeon: Rogene Houston, MD;  Location: [facility];  Service: Endoscopy;;  cecal (CB), transverse colon (CB), rectal polyp (HS)  . Right hip core decompression      Family History  Problem Relation Age of Onset  . Breast cancer Mother   . Cancer Father        Lung  . Hypertension Son   . Kidney disease Son   . Kidney cancer Son   . Colon cancer Neg Hx     Social History   Socioeconomic History  . Marital status: Married    Spouse name: Vaughan Basta  . Number of children: 2  . Years of education: 64  . Highest education level: High school graduate  Occupational History  . 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 Smoking status: Former Smoker    Packs/day: 1.00    Years: 25.00    Pack years: 25.00    Types:  Cigarettes    Quit date: 02/24/1999    Years since quitting: 21.5  . Smokeless tobacco: Never Used  Vaping Use  . Vaping Use: Never used  Substance and Sexual Activity  . Alcohol use: No  . Drug use: No  . Sexual activity: Not Currently  Other Topics Concern  . Not on file  Social History Narrative  . Not on file   Social Determinants of Health   Financial Resource Strain: Not on file  Food Insecurity: Not on file  Transportation Needs: Not on file  Physical Activity: Not on file  Stress: Not on file  Social Connections: Not on file  Intimate Partner Violence: Not on file    Outpatient Medications Prior to Visit  Medication Sig Dispense Refill  . atorvastatin (LIPITOR) 20 MG tablet Take 1 tablet (20 mg total) by mouth daily. 90 tablet 3  . GENTEAL MILD 0.2 % SOLN Place 1 drop into both eyes daily.    Marland Kitchen levothyroxine (SYNTHROID) 112 MCG tablet Take 1 tablet (112 mcg total) by mouth daily. 90 tablet 1  . memantine (NAMENDA XR) 14 MG CP24 24 hr capsule Take 1 capsule (14 mg total) by mouth daily. 90 capsule 3  . Multiple Vitamin (MULTIVITAMIN WITH MINERALS) TABS Take 1 tablet by mouth daily. Centrum Silver    .  naproxen sodium (ALEVE) 220 MG tablet Take 220 mg by mouth daily as needed (for pain or headache).     . Omega-3 Fatty Acids (FISH OIL) 500 MG CAPS Take 500 mg by mouth daily.    Marland Kitchen omeprazole (PRILOSEC) 20 MG capsule TAKE 1 CAPSULE BY MOUTH EVERY DAY IN THE MORNING 90 capsule 3   No facility-administered medications prior to visit.    No Known Allergies  Review of Systems  Constitutional: Negative.   HENT: Positive for tinnitus. Negative for ear discharge and ear pain.   Respiratory: Negative.   Cardiovascular: Negative.   Gastrointestinal: Negative.   Endocrine: Negative.   Musculoskeletal: Negative.   Skin: Negative.   Neurological: Positive for dizziness. Negative for headaches.  All other systems reviewed and are negative.      Objective:    Physical  Exam Vitals reviewed.  HENT:     Head:     Comments: Ringing in ear    Right Ear: There is no impacted cerumen.     Left Ear: There is no impacted cerumen.     Nose: Nose normal.  Eyes:     Conjunctiva/sclera: Conjunctivae normal.  Cardiovascular:     Rate and Rhythm: Normal rate and regular rhythm.  Pulmonary:     Effort: Pulmonary effort is normal.     Breath sounds: Normal breath sounds.  Abdominal:     General: Bowel sounds are normal.  Musculoskeletal:     Cervical back: Normal range of motion.  Skin:    General: Skin is warm.  Neurological:     Mental Status: He is alert and oriented to person, place, and time.     Comments: Virtigo  Psychiatric:        Behavior: Behavior normal.     BP 140/72 (Patient Position: Standing)   Temp 97.7 F (36.5 C)   Ht 6\' 1"  (1.854 m)   Wt 173 lb 9.6 oz (78.7 kg)   SpO2 100%   BMI 22.90 kg/m  Wt Readings from Last 3 Encounters:  08/26/20 173 lb 9.6 oz (78.7 kg)  07/25/20 178 lb (80.7 kg)  03/04/20 175 lb (79.4 kg)    There are no preventive care reminders to display for this patient.  There are no preventive care reminders to display for this patient.   Lab Results  Component Value Date   TSH 5.420 (H) 07/25/2020   Lab Results  Component Value Date   WBC 8.5 07/25/2020   HGB 14.3 07/25/2020   HCT 41.3 07/25/2020   MCV 95 07/25/2020   PLT 171 07/25/2020   Lab Results  Component Value Date   NA 141 07/25/2020   K 3.9 07/25/2020   CO2 27 07/25/2020   GLUCOSE 94 07/25/2020   BUN 16 07/25/2020   CREATININE 0.98 07/25/2020   BILITOT 1.4 (H) 07/25/2020   ALKPHOS 114 07/25/2020   AST 26 07/25/2020   ALT 14 07/25/2020   PROT 6.0 07/25/2020   ALBUMIN 4.0 07/25/2020   CALCIUM 9.2 07/25/2020   Lab Results  Component Value Date   CHOL 121 07/25/2020   Lab Results  Component Value Date   HDL 55 07/25/2020   Lab Results  Component Value Date   LDLCALC 51 07/25/2020   Lab Results  Component Value Date    TRIG 71 07/25/2020   Lab Results  Component Value Date   CHOLHDL 2.2 07/25/2020   No results found for: HGBA1C     Assessment & Plan:   Problem  List Items Addressed This Visit      Other   Vertigo - Primary    symptoms not well controlled, started patent on meclizine.  Follow-up with worsening or unresolved symptoms.  Plan is to refer her to PT if symptoms not well controlled.      Relevant Medications   meclizine (ANTIVERT) 12.5 MG tablet       Meds ordered this encounter  Medications  . meclizine (ANTIVERT) 12.5 MG tablet    Sig: Take 1 tablet (12.5 mg total) by mouth 3 (three) times daily as needed for dizziness.    Dispense:  30 tablet    Refill:  0    Order Specific Question:   Supervising Provider    Answer:   Janora Norlander [[id number]]     Ivy Lynn, NP

## 2020-08-26 NOTE — Assessment & Plan Note (Signed)
symptoms not well controlled, started patent on meclizine.  Follow-up with worsening or unresolved symptoms.  Plan is to refer her to PT if symptoms not well controlled.

## 2020-09-26 DIAGNOSIS — X32XXXD Exposure to sunlight, subsequent encounter: Secondary | ICD-10-CM | POA: Diagnosis not present

## 2020-09-26 DIAGNOSIS — L570 Actinic keratosis: Secondary | ICD-10-CM | POA: Diagnosis not present

## 2020-10-31 ENCOUNTER — Encounter: Payer: Self-pay | Admitting: Family Medicine

## 2020-10-31 ENCOUNTER — Other Ambulatory Visit: Payer: Self-pay

## 2020-10-31 ENCOUNTER — Ambulatory Visit (INDEPENDENT_AMBULATORY_CARE_PROVIDER_SITE_OTHER): Payer: Medicare Other | Admitting: Family Medicine

## 2020-10-31 VITALS — BP 147/62 | HR 70 | Ht 73.0 in | Wt 175.0 lb

## 2020-10-31 DIAGNOSIS — E039 Hypothyroidism, unspecified: Secondary | ICD-10-CM | POA: Diagnosis not present

## 2020-10-31 DIAGNOSIS — E782 Mixed hyperlipidemia: Secondary | ICD-10-CM

## 2020-10-31 DIAGNOSIS — F0391 Unspecified dementia with behavioral disturbance: Secondary | ICD-10-CM | POA: Diagnosis not present

## 2020-10-31 DIAGNOSIS — K219 Gastro-esophageal reflux disease without esophagitis: Secondary | ICD-10-CM

## 2020-10-31 DIAGNOSIS — R413 Other amnesia: Secondary | ICD-10-CM | POA: Diagnosis not present

## 2020-10-31 MED ORDER — OMEPRAZOLE 20 MG PO CPDR: 20.0000 mg | DELAYED_RELEASE_CAPSULE | Freq: Every day | ORAL | 3 refills | Status: DC

## 2020-10-31 MED ORDER — LEVOTHYROXINE SODIUM 112 MCG PO TABS: 112.0000 ug | ORAL_TABLET | Freq: Every day | ORAL | 1 refills | Status: DC

## 2020-10-31 MED ORDER — ATORVASTATIN CALCIUM 20 MG PO TABS: 20.0000 mg | ORAL_TABLET | Freq: Every day | ORAL | 3 refills | Status: DC

## 2020-10-31 MED ORDER — MEMANTINE HCL ER 21 MG PO CP24: 21.0000 mg | ORAL_CAPSULE | Freq: Every day | ORAL | 3 refills | Status: DC

## 2020-10-31 NOTE — Patient Instructions (Signed)
[facility]

## 2020-10-31 NOTE — Progress Notes (Signed)
BP (!) 147/62   Pulse 70   Ht 6\' 1"  (1.854 m)   Wt 175 lb (79.4 kg)   SpO2 98%   BMI 23.09 kg/m    Subjective:   Patient ID: Christian Johnson, male    DOB: 1941-05-27, 80 y.o.   MRN: [id number]  HPI: Christian Johnson is a 80 y.o. male presenting on 10/31/2020 for Medical Management of Chronic Issues, Hypothyroidism, Anemia, and Dementia   HPI Hypothyroidism recheck Patient is coming in for thyroid recheck today as well. They deny any issues with hair changes or heat or cold problems or diarrhea or constipation. They deny any chest pain or palpitations. They are currently on levothyroxine 112 micrograms   Hyperlipidemia Patient is coming in for recheck of his hyperlipidemia. The patient is currently taking fish oil and atorvastatin. They deny any issues with myalgias or history of liver damage from it. They deny any focal numbness or weakness or chest pain.   GERD Patient is currently on omeprazole.  She denies any major symptoms or abdominal pain or belching or burping. She denies any blood in her stool or lightheadedness or dizziness.   Memory disorder Patient's wife has noticed increased memory issues and that it is getting worse.  He is forgetting more things more things, like he was just told something and He will forget it a few minutes later.  He does not forget close family or close friends but does forget acquaintances often forgets the names. MMSE - Mini Mental State Exam 10/31/2020 12/25/2019 04/10/2019  Orientation to time 3 5 5   Orientation to Place 2 2 3   Registration 3 3 3   Attention/ Calculation 4 0 3  Recall 3 1 0  Language- name 2 objects 2 2 2   Language- repeat 1 1 1   Language- follow 3 step command 2 3 3   Language- read & follow direction 1 1 1   Write a sentence 1 1 1   Copy design 1 1 1   Total score 23 20 23      Relevant past medical, surgical, family and social history reviewed and updated as indicated. Interim medical history since our last visit  reviewed. Allergies and medications reviewed and updated.  Review of Systems  Constitutional: Negative for chills and fever.  Eyes: Negative for visual disturbance.  Respiratory: Negative for shortness of breath and wheezing.   Cardiovascular: Negative for chest pain and leg swelling.  Musculoskeletal: Negative for back pain and gait problem.  Skin: Negative for rash.  Psychiatric/Behavioral: Positive for confusion and decreased concentration. Negative for dysphoric mood, self-injury, sleep disturbance and suicidal ideas.  All other systems reviewed and are negative.   Per HPI unless specifically indicated above   Allergies as of 10/31/2020   No Known Allergies     Medication List       Accurate as of October 31, 2020  2:14 PM. If you have any questions, ask your nurse or doctor.        atorvastatin 20 MG tablet Commonly known as: LIPITOR Take 1 tablet (20 mg total) by mouth daily.   Fish Oil 500 MG Caps Take 500 mg by mouth daily.   GenTeal Mild 0.2 % Soln Generic drug: Hypromellose Place 1 drop into both eyes daily.   levothyroxine 112 MCG tablet Commonly known as: SYNTHROID Take 1 tablet (112 mcg total) by mouth daily.   meclizine 12.5 MG tablet Commonly known as: ANTIVERT Take 1 tablet (12.5 mg total) by mouth 3 (three) times daily as  needed for dizziness.   memantine 14 MG Cp24 24 hr capsule Commonly known as: NAMENDA XR Take 1 capsule (14 mg total) by mouth daily.   multivitamin with minerals Tabs tablet Take 1 tablet by mouth daily. Centrum Silver   naproxen sodium 220 MG tablet Commonly known as: ALEVE Take 220 mg by mouth daily as needed (for pain or headache).   omeprazole 20 MG capsule Commonly known as: PRILOSEC TAKE 1 CAPSULE BY MOUTH EVERY DAY IN THE MORNING        Objective:   BP (!) 147/62   Pulse 70   Ht 6\' 1"  (1.854 m)   Wt 175 lb (79.4 kg)   SpO2 98%   BMI 23.09 kg/m   Wt Readings from Last 3 Encounters:  10/31/20 175 lb  (79.4 kg)  08/26/20 173 lb 9.6 oz (78.7 kg)  07/25/20 178 lb (80.7 kg)    Physical Exam Vitals and nursing note reviewed.  Constitutional:      General: He is not in acute distress.    Appearance: He is well-developed. He is not diaphoretic.  Eyes:     General: No scleral icterus.       Right eye: No discharge.     Conjunctiva/sclera: Conjunctivae normal.     Pupils: Pupils are equal, round, and reactive to light.  Neck:     Thyroid: No thyromegaly.  Cardiovascular:     Rate and Rhythm: Normal rate and regular rhythm.     Heart sounds: Normal heart sounds. No murmur heard.   Pulmonary:     Effort: Pulmonary effort is normal. No respiratory distress.     Breath sounds: Normal breath sounds. No wheezing.  Musculoskeletal:        General: Normal range of motion.     Cervical back: Neck supple.  Lymphadenopathy:     Cervical: No cervical adenopathy.  Skin:    General: Skin is warm and dry.     Findings: No rash.  Neurological:     Mental Status: He is alert and oriented to person, place, and time.     Coordination: Coordination normal.  Psychiatric:        Behavior: Behavior normal.       Assessment & Plan:   Problem List Items Addressed This Visit      Digestive   GERD (gastroesophageal reflux disease)   Relevant Medications   omeprazole (PRILOSEC) 20 MG capsule     Endocrine   Hypothyroidism - Primary   Relevant Medications   levothyroxine (SYNTHROID) 112 MCG tablet   Other Relevant Orders   Thyroid Panel With TSH     Nervous and Auditory   Dementia (HCC)   Relevant Medications   memantine (NAMENDA XR) 21 MG CP24 24 hr capsule     Other   Hyperlipidemia   Relevant Medications   atorvastatin (LIPITOR) 20 MG tablet    Other Visit Diagnoses    Memory difficulties       Relevant Medications   memantine (NAMENDA XR) 21 MG CP24 24 hr capsule      Will increase the Namenda to 21 mg daily and see if that is better for him.  Continue other medication as  is.  We will check thyroid levels again today. Follow up plan: Return in about 6 months (around 05/02/2021), or if symptoms worsen or fail to improve, for Thyroid and cholesterol recheck.  Counseling provided for all of the vaccine components No orders of the defined types were placed in  this encounter.   Caryl Pina, MD [facility] 10/31/2020, 2:14 PM

## 2020-11-01 LAB — THYROID PANEL WITH TSH
Free Thyroxine Index: 2.6 (ref 1.2–4.9)
T3 Uptake Ratio: 28 % (ref 24–39)
T4, Total: 9.3 ug/dL (ref 4.5–12.0)
TSH: 0.171 u[IU]/mL — ABNORMAL LOW (ref 0.450–4.500)

## 2020-11-17 DIAGNOSIS — H905 Unspecified sensorineural hearing loss: Secondary | ICD-10-CM | POA: Diagnosis not present

## 2020-11-18 ENCOUNTER — Telehealth: Payer: Self-pay

## 2020-11-18 DIAGNOSIS — R42 Dizziness and giddiness: Secondary | ICD-10-CM

## 2020-11-18 MED ORDER — MECLIZINE HCL 12.5 MG PO TABS: 12.5000 mg | ORAL_TABLET | Freq: Three times a day (TID) | ORAL | 2 refills | Status: DC | PRN

## 2020-11-18 NOTE — Telephone Encounter (Signed)
Prescription sent to pharmacy, patient aware 

## 2021-01-19 ENCOUNTER — Ambulatory Visit (INDEPENDENT_AMBULATORY_CARE_PROVIDER_SITE_OTHER): Payer: Medicare Other | Admitting: Family Medicine

## 2021-01-19 ENCOUNTER — Ambulatory Visit: Payer: Medicare Other | Admitting: Family Medicine

## 2021-01-19 ENCOUNTER — Other Ambulatory Visit: Payer: Self-pay

## 2021-01-19 ENCOUNTER — Encounter: Payer: Self-pay | Admitting: Family Medicine

## 2021-01-19 VITALS — BP 130/71 | HR 77 | Temp 98.1°F | Ht 73.0 in | Wt 169.8 lb

## 2021-01-19 DIAGNOSIS — R5383 Other fatigue: Secondary | ICD-10-CM

## 2021-01-19 DIAGNOSIS — E039 Hypothyroidism, unspecified: Secondary | ICD-10-CM | POA: Diagnosis not present

## 2021-01-19 DIAGNOSIS — R42 Dizziness and giddiness: Secondary | ICD-10-CM | POA: Diagnosis not present

## 2021-01-19 DIAGNOSIS — B070 Plantar wart: Secondary | ICD-10-CM

## 2021-01-19 DIAGNOSIS — F321 Major depressive disorder, single episode, moderate: Secondary | ICD-10-CM | POA: Diagnosis not present

## 2021-01-19 NOTE — Progress Notes (Addendum)
Acute Office Visit  Subjective:    Patient ID: Christian Johnson, male    DOB: 28-Apr-1941, 80 y.o.   MRN: [id number]  Chief Complaint  Patient presents with   Fatigue    HPI Patient is in today for fatigue for 2-3 months. Here with wife today. Denies chest pain, shortness of breath, edema, or palpitations.   [name] reports some dizziness at times as well. He reports this occurs when he changes positions. He reports it as feeling like the room is spinning. It last for a few minutes at time. He is taking meclizine for vertigo without much improvement. He drinks 2-3 16 ounces cups of water days. He eats 3 meals a day. He sometimes snacks between. Denies syncope.   He also has a wart on the bottom on his right foot. This has been present for a few month. He has tried OTC measures without improvement. It is painful to walk on sometimes.   Depression screen [facility] 2/9 01/19/2021 10/31/2020 08/26/2020  Decreased Interest 1 0 0  Down, Depressed, Hopeless '1 1 1  ' PHQ - 2 Score '2 1 1  ' Altered sleeping 0 - 0  Tired, decreased energy 3 - 0  Change in appetite 0 - 0  Feeling bad or failure about yourself  1 - 1  Trouble concentrating 1 - 1  Moving slowly or fidgety/restless 0 - 0  Suicidal thoughts 1 - 1  PHQ-9 Score 8 - 4  Difficult doing work/chores - - Somewhat difficult   GAD 7 : Generalized Anxiety Score 01/19/2021  Nervous, Anxious, on Edge 1  Control/stop worrying 1  Worry too much - different things 1  Trouble relaxing 0  Restless 0  Easily annoyed or irritable 1  Afraid - awful might happen 0  Total GAD 7 Score 4  Anxiety Difficulty Somewhat difficult      Past Medical History:  Diagnosis Date   Cancer [facility])    Prostate   Cataract    Colon polyps    GERD (gastroesophageal reflux disease)    Hyperlipidemia    Hypothyroidism    Memory change    Prostate cancer [facility])     Past Surgical History:  Procedure Laterality Date   CATARACT EXTRACTION W/PHACO Left 03/09/2019    Procedure: CATARACT EXTRACTION PHACO AND INTRAOCULAR LENS PLACEMENT (IOC);  Surgeon: Baruch Goldmann, MD;  Location: [facility] ORS;  Service: Ophthalmology;  Laterality: Left;  left, CDE: 7.70   COLONOSCOPY  01/31/2012   Procedure: COLONOSCOPY;  Surgeon: Rogene Houston, MD;  Location: [facility] ENDO SUITE;  Service: Endoscopy;  Laterality: N/A;  930   COLONOSCOPY N/A 11/06/2017   Procedure: COLONOSCOPY;  Surgeon: Rogene Houston, MD;  Location: [facility] ENDO SUITE;  Service: Endoscopy;  Laterality: N/A;  830   Colonoscopy with polyp removal     EYE SURGERY Left 07/08/2018   cataracts   POLYPECTOMY  11/06/2017   Procedure: POLYPECTOMY;  Surgeon: Rogene Houston, MD;  Location: [facility] ENDO SUITE;  Service: Endoscopy;;  cecal (CB), transverse colon (CB), rectal polyp (HS)   Right hip core decompression      Family History  Problem Relation Age of Onset   Breast cancer Mother    Cancer Father        Lung   Hypertension Son    Kidney disease Son    Kidney cancer Son    Colon cancer Neg Hx     Social History   Socioeconomic History   Marital status: Married  Spouse name: Vaughan Basta   Number of children: 2   Years of education: 12   Highest education level: High school graduate  Occupational History   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  Tobacco Use   Smoking status: Former    Packs/day: 1.00    Years: 25.00    Pack years: 25.00    Types: Cigarettes    Quit date: 02/24/1999    Years since quitting: 21.9   Smokeless tobacco: Never  Vaping Use   Vaping Use: Never used  Substance and Sexual Activity   Alcohol use: No   Drug use: No   Sexual activity: Not Currently  Other Topics Concern   Not on file  Social History Narrative   Not on file   Social Determinants of Health   Financial Resource Strain: Not on file  Food Insecurity: Not on file  Transportation Needs: Not on file  Physical Activity: Not on file  Stress: Not on file  Social Connections: Not on file  Intimate Partner  Violence: Not on file    Outpatient Medications Prior to Visit  Medication Sig Dispense Refill   atorvastatin (LIPITOR) 20 MG tablet Take 1 tablet (20 mg total) by mouth daily. 90 tablet 3   GENTEAL MILD 0.2 % SOLN Place 1 drop into both eyes daily.     levothyroxine (SYNTHROID) 112 MCG tablet Take 1 tablet (112 mcg total) by mouth daily. 90 tablet 1   meclizine (ANTIVERT) 12.5 MG tablet Take 1 tablet (12.5 mg total) by mouth 3 (three) times daily as needed for dizziness. 30 tablet 2   memantine (NAMENDA XR) 21 MG CP24 24 hr capsule Take 1 capsule (21 mg total) by mouth daily. 90 capsule 3   Multiple Vitamin (MULTIVITAMIN WITH MINERALS) TABS Take 1 tablet by mouth daily. Centrum Silver     naproxen sodium (ALEVE) 220 MG tablet Take 220 mg by mouth daily as needed (for pain or headache).      Omega-3 Fatty Acids (FISH OIL) 500 MG CAPS Take 500 mg by mouth daily.     omeprazole (PRILOSEC) 20 MG capsule Take 1 capsule (20 mg total) by mouth daily. 90 capsule 3   No facility-administered medications prior to visit.    No Known Allergies  Review of Systems As per HPI.     Objective:    Physical Exam Vitals and nursing note reviewed.  Constitutional:      General: He is not in acute distress.    Appearance: He is not ill-appearing, toxic-appearing or diaphoretic.  HENT:     Head: Normocephalic and atraumatic.     Right Ear: Tympanic membrane, ear canal and external ear normal.     Left Ear: Tympanic membrane, ear canal and external ear normal.  Eyes:     Extraocular Movements: Extraocular movements intact.     Conjunctiva/sclera: Conjunctivae normal.     Pupils: Pupils are equal, round, and reactive to light.  Cardiovascular:     Rate and Rhythm: Normal rate and regular rhythm.     Heart sounds: Normal heart sounds. No murmur heard. Pulmonary:     Effort: Pulmonary effort is normal. No respiratory distress.     Breath sounds: Normal breath sounds.  Musculoskeletal:     Cervical  back: Neck supple.     Right lower leg: No edema.     Left lower leg: No edema.     Comments: Plantar wart present on right foot.   Skin:  General: Skin is warm and dry.  Neurological:     Mental Status: He is alert and oriented to person, place, and time.     Motor: No weakness.     Gait: Gait normal.  Psychiatric:        Mood and Affect: Mood normal.        Behavior: Behavior normal.    BP 140/73   Pulse 77   Temp 98.1 F (36.7 C)   Ht '6\' 1"'  (1.854 m)   Wt 169 lb 12.8 oz (77 kg)   SpO2 96%   BMI 22.40 kg/m  Wt Readings from Last 3 Encounters:  01/19/21 169 lb 12.8 oz (77 kg)  10/31/20 175 lb (79.4 kg)  08/26/20 173 lb 9.6 oz (78.7 kg)    Health Maintenance Due  Topic Date Due   Zoster Vaccines- Shingrix (1 of 2) Never done   COVID-19 Vaccine (3 - Moderna risk series) 12/09/2019   TETANUS/TDAP  12/31/2020    There are no preventive care reminders to display for this patient.   Lab Results  Component Value Date   TSH 0.171 (L) 10/31/2020   Lab Results  Component Value Date   WBC 8.5 07/25/2020   HGB 14.3 07/25/2020   HCT 41.3 07/25/2020   MCV 95 07/25/2020   PLT 171 07/25/2020   Lab Results  Component Value Date   NA 141 07/25/2020   K 3.9 07/25/2020   CO2 27 07/25/2020   GLUCOSE 94 07/25/2020   BUN 16 07/25/2020   CREATININE 0.98 07/25/2020   BILITOT 1.4 (H) 07/25/2020   ALKPHOS 114 07/25/2020   AST 26 07/25/2020   ALT 14 07/25/2020   PROT 6.0 07/25/2020   ALBUMIN 4.0 07/25/2020   CALCIUM 9.2 07/25/2020   Lab Results  Component Value Date   CHOL 121 07/25/2020   Lab Results  Component Value Date   HDL 55 07/25/2020   Lab Results  Component Value Date   LDLCALC 51 07/25/2020   Lab Results  Component Value Date   TRIG 71 07/25/2020   Lab Results  Component Value Date   CHOLHDL 2.2 07/25/2020   No results found for: HGBA1C     Assessment & Plan:   Man was seen today for fatigue.  Diagnoses and all orders for this  visit:  Fatigue, unspecified type Exam unremarkable today. Labs pending as below.  -     CBC with Differential/Platelet -     CMP14+EGFR -     TSH  Depression Discussed positive depression scoring and that this could be contributing factor. He has tried an antidepressant in the past and did not like how it made him feel, he is hesitant to try this again.   Vertigo Normal orthostatics today. Labs pending. Continue meclizine. Discussed hydration, moving slowly. Discussed PT, patient will think about this.  -     CBC with Differential/Platelet -     CMP14+EGFR -     TSH  Acquired hypothyroidism On synthroid. Last TSH was low. Will recheck today.  -     TSH  Plantar wart of right foot -     Ambulatory referral to Podiatry  Return to office for new or worsening symptoms, or if symptoms persist.   The patient indicates understanding of these issues and agrees with the plan.   Gwenlyn Perking, FNP

## 2021-01-20 ENCOUNTER — Other Ambulatory Visit: Payer: Self-pay | Admitting: Family Medicine

## 2021-01-20 DIAGNOSIS — E039 Hypothyroidism, unspecified: Secondary | ICD-10-CM

## 2021-01-20 LAB — CMP14+EGFR
ALT: 10 IU/L (ref 0–44)
AST: 21 IU/L (ref 0–40)
Albumin/Globulin Ratio: 1.8 (ref 1.2–2.2)
Albumin: 4 g/dL (ref 3.7–4.7)
Alkaline Phosphatase: 131 IU/L — ABNORMAL HIGH (ref 44–121)
BUN/Creatinine Ratio: 18 (ref 10–24)
BUN: 19 mg/dL (ref 8–27)
Bilirubin Total: 0.9 mg/dL (ref 0.0–1.2)
CO2: 28 mmol/L (ref 20–29)
Calcium: 9 mg/dL (ref 8.6–10.2)
Chloride: 106 mmol/L (ref 96–106)
Creatinine, Ser: 1.07 mg/dL (ref 0.76–1.27)
Globulin, Total: 2.2 g/dL (ref 1.5–4.5)
Glucose: 77 mg/dL (ref 65–99)
Potassium: 4.4 mmol/L (ref 3.5–5.2)
Sodium: 146 mmol/L — ABNORMAL HIGH (ref 134–144)
Total Protein: 6.2 g/dL (ref 6.0–8.5)
eGFR: 71 mL/min/{1.73_m2} (ref >59)

## 2021-01-20 LAB — CBC WITH DIFFERENTIAL/PLATELET
Basophils Absolute: 0 10*3/uL (ref 0.0–0.2)
Basos: 1 %
EOS (ABSOLUTE): 0.2 10*3/uL (ref 0.0–0.4)
Eos: 3 %
Hematocrit: 38.9 % (ref 37.5–51.0)
Hemoglobin: 13.1 g/dL (ref 13.0–17.7)
Immature Grans (Abs): 0 10*3/uL (ref 0.0–0.1)
Immature Granulocytes: 0 %
Lymphocytes Absolute: 1.2 10*3/uL (ref 0.7–3.1)
Lymphs: 16 %
MCH: 32.2 pg (ref 26.6–33.0)
MCHC: 33.7 g/dL (ref 31.5–35.7)
MCV: 96 fL (ref 79–97)
Monocytes Absolute: 0.7 10*3/uL (ref 0.1–0.9)
Monocytes: 10 %
Neutrophils Absolute: 5.1 10*3/uL (ref 1.4–7.0)
Neutrophils: 70 %
Platelets: 177 10*3/uL (ref 150–450)
RBC: 4.07 x10E6/uL — ABNORMAL LOW (ref 4.14–5.80)
RDW: 13.1 % (ref 11.6–15.4)
WBC: 7.2 10*3/uL (ref 3.4–10.8)

## 2021-01-20 LAB — TSH: TSH: 0.103 u[IU]/mL — ABNORMAL LOW (ref 0.450–4.500)

## 2021-01-20 MED ORDER — LEVOTHYROXINE SODIUM 100 MCG PO TABS: 100.0000 ug | ORAL_TABLET | Freq: Every day | ORAL | 3 refills | Status: DC

## 2021-02-23 ENCOUNTER — Telehealth: Payer: Self-pay | Admitting: Family Medicine

## 2021-02-23 NOTE — Telephone Encounter (Signed)
Spoke with wife.  She is concerned because husband has been experiencing some fatigue and dizziness lately.  He has seen other providers but she would like for him to see Dr. Warrick Parisian.  Appointment scheduled for 03/02/21.

## 2021-02-28 DIAGNOSIS — D2371 Other benign neoplasm of skin of right lower limb, including hip: Secondary | ICD-10-CM | POA: Diagnosis not present

## 2021-02-28 DIAGNOSIS — M79671 Pain in right foot: Secondary | ICD-10-CM | POA: Diagnosis not present

## 2021-02-28 DIAGNOSIS — L03122 Acute lymphangitis of left axilla: Secondary | ICD-10-CM | POA: Diagnosis not present

## 2021-02-28 DIAGNOSIS — M79672 Pain in left foot: Secondary | ICD-10-CM | POA: Diagnosis not present

## 2021-03-02 ENCOUNTER — Encounter: Payer: Self-pay | Admitting: Family Medicine

## 2021-03-02 ENCOUNTER — Other Ambulatory Visit: Payer: Self-pay

## 2021-03-02 ENCOUNTER — Ambulatory Visit (INDEPENDENT_AMBULATORY_CARE_PROVIDER_SITE_OTHER): Payer: Medicare Other | Admitting: Family Medicine

## 2021-03-02 VITALS — BP 128/68 | HR 76 | Ht 73.0 in | Wt 171.0 lb

## 2021-03-02 DIAGNOSIS — E039 Hypothyroidism, unspecified: Secondary | ICD-10-CM

## 2021-03-02 DIAGNOSIS — L03116 Cellulitis of left lower limb: Secondary | ICD-10-CM | POA: Diagnosis not present

## 2021-03-02 NOTE — Progress Notes (Signed)
BP 128/68   Pulse 76   Ht _0  (1.854 m)   Wt 171 lb (77.6 kg)   SpO2 98%   BMI 22.56 kg/m    Subjective:   Patient ID: Christian Johnson, male    DOB: 03-20-1941, 80 y.o.   MRN: [id number]  HPI: Christian Johnson is a 80 y.o. male presenting on 03/02/2021 for Dizziness, Fatigue, and Ankle Pain (Left. Swelling, red)   HPI Patient is coming in today with complaints of fatigue dizziness and ankle pain.  This is all been going on for couple weeks.  He was seen here by nurse practitioner and then also seen by podiatry and was deemed to have cellulitis in his left ankle and is having a lot of swelling and redness and was given an antibiotic for which she has been taking 2 days worth of the antibiotic and I feel like it is improving.  He also says he was out a lot last week mowing multiple ones in the heat may have attributed to the way he is feeling in this week his wife says she is trying to hydrate in the morning she does feel like his energy is coming back some and he is not as dizzy.  He is taking Keflex for the antibiotic for the cellulitis.  Patient denies any fevers or chills.  Patient is not any more confused than he normally is with his dementia.  Relevant past medical, surgical, family and social history reviewed and updated as indicated. Interim medical history since our last visit reviewed. Allergies and medications reviewed and updated.  Review of Systems  Constitutional:  Positive for fatigue. Negative for chills and fever.  Eyes:  Negative for visual disturbance.  Respiratory:  Negative for shortness of breath and wheezing.   Cardiovascular:  Negative for chest pain and leg swelling.  Musculoskeletal:  Negative for back pain and gait problem.  Skin:  Negative for rash.  Neurological:  Positive for dizziness. Negative for weakness and light-headedness.  All other systems reviewed and are negative.  Per HPI unless specifically indicated above   Allergies as of 03/02/2021   No  Known Allergies      Medication List        Accurate as of March 02, 2021  2:38 PM. If you have any questions, ask your nurse or doctor.          atorvastatin 20 MG tablet Commonly known as: LIPITOR Take 1 tablet (20 mg total) by mouth daily.   calcium-vitamin D 500-200 MG-UNIT tablet Commonly known as: OSCAL WITH D Take 1 tablet by mouth daily.   Fish Oil 500 MG Caps Take 500 mg by mouth daily.   GenTeal Mild 0.2 % Soln Generic drug: Hypromellose Place 1 drop into both eyes daily.   levothyroxine 100 MCG tablet Commonly known as: SYNTHROID Take 1 tablet (100 mcg total) by mouth daily.   meclizine 12.5 MG tablet Commonly known as: ANTIVERT Take 1 tablet (12.5 mg total) by mouth 3 (three) times daily as needed for dizziness.   memantine 21 MG Cp24 24 hr capsule Commonly known as: NAMENDA XR Take 1 capsule (21 mg total) by mouth daily.   multivitamin with minerals Tabs tablet Take 1 tablet by mouth daily. Centrum Silver   naproxen sodium 220 MG tablet Commonly known as: ALEVE Take 220 mg by mouth daily as needed (for pain or headache).   omeprazole 20 MG capsule Commonly known as: PRILOSEC Take 1 capsule (20 mg  total) by mouth daily.   vitamin B-12 500 MCG tablet Commonly known as: CYANOCOBALAMIN Take 500 mcg by mouth daily.         Objective:   BP 128/68   Pulse 76   Ht _0  (1.854 m)   Wt 171 lb (77.6 kg)   SpO2 98%   BMI 22.56 kg/m   Wt Readings from Last 3 Encounters:  03/02/21 171 lb (77.6 kg)  01/19/21 169 lb 12.8 oz (77 kg)  10/31/20 175 lb (79.4 kg)    Physical Exam Vitals and nursing note reviewed.  Constitutional:      General: He is not in acute distress.    Appearance: He is well-developed. He is not diaphoretic.  Eyes:     General: No scleral icterus.       Right eye: No discharge.     Conjunctiva/sclera: Conjunctivae normal.     Pupils: Pupils are equal, round, and reactive to light.  Neck:     Thyroid: No  thyromegaly.  Cardiovascular:     Rate and Rhythm: Normal rate and regular rhythm.     Heart sounds: Normal heart sounds. No murmur heard. Pulmonary:     Effort: Pulmonary effort is normal. No respiratory distress.     Breath sounds: Normal breath sounds. No wheezing.  Musculoskeletal:        General: Swelling (Left lower extremity swelling, slight erythema around the ankle.  Swelling is 1+.) present. No tenderness or deformity. Normal range of motion.     Cervical back: Neck supple.  Lymphadenopathy:     Cervical: No cervical adenopathy.  Skin:    General: Skin is warm and dry.     Findings: No rash.  Neurological:     Mental Status: He is alert and oriented to person, place, and time.     Coordination: Coordination normal.  Psychiatric:        Behavior: Behavior normal.    Results for orders placed or performed in visit on 01/19/21  CBC with Differential/Platelet  Result Value Ref Range   WBC 7.2 3.4 - 10.8 x10E3/uL   RBC 4.07 (L) 4.14 - 5.80 x10E6/uL   Hemoglobin 13.1 13.0 - 17.7 g/dL   Hematocrit 38.9 37.5 - 51.0 %   MCV 96 79 - 97 fL   MCH 32.2 26.6 - 33.0 pg   MCHC 33.7 31.5 - 35.7 g/dL   RDW 13.1 11.6 - 15.4 %   Platelets 177 150 - 450 x10E3/uL   Neutrophils 70 Not Estab. %   Lymphs 16 Not Estab. %   Monocytes 10 Not Estab. %   Eos 3 Not Estab. %   Basos 1 Not Estab. %   Neutrophils Absolute 5.1 1.4 - 7.0 x10E3/uL   Lymphocytes Absolute 1.2 0.7 - 3.1 x10E3/uL   Monocytes Absolute 0.7 0.1 - 0.9 x10E3/uL   EOS (ABSOLUTE) 0.2 0.0 - 0.4 x10E3/uL   Basophils Absolute 0.0 0.0 - 0.2 x10E3/uL   Immature Granulocytes 0 Not Estab. %   Immature Grans (Abs) 0.0 0.0 - 0.1 x10E3/uL  CMP14+EGFR  Result Value Ref Range   Glucose 77 65 - 99 mg/dL   BUN 19 8 - 27 mg/dL   Creatinine, Ser 1.07 0.76 - 1.27 mg/dL   eGFR 71 >59 mL/min/1.73   BUN/Creatinine Ratio 18 10 - 24   Sodium 146 (H) 134 - 144 mmol/L   Potassium 4.4 3.5 - 5.2 mmol/L   Chloride 106 96 - 106 mmol/L   CO2  28 20 -  29 mmol/L   Calcium 9.0 8.6 - 10.2 mg/dL   Total Protein 6.2 6.0 - 8.5 g/dL   Albumin 4.0 3.7 - 4.7 g/dL   Globulin, Total 2.2 1.5 - 4.5 g/dL   Albumin/Globulin Ratio 1.8 1.2 - 2.2   Bilirubin Total 0.9 0.0 - 1.2 mg/dL   Alkaline Phosphatase 131 (H) 44 - 121 IU/L   AST 21 0 - 40 IU/L   ALT 10 0 - 44 IU/L  TSH  Result Value Ref Range   TSH 0.103 (L) 0.450 - 4.500 uIU/mL    Assessment & Plan:   Problem List Items Addressed This Visit       Endocrine   Hypothyroidism - Primary   Other Visit Diagnoses     Cellulitis of left lower extremity           Patient is already on Keflex for cellulitis from the podiatrist, has been taking for 2 days.  His thyroid was also adjusted because he was hyperthyroid, we lowered his level so its more normal.  We will have him come back in 1 to 2 months to recheck.  They already have an appointment in October. Follow up plan: Return if symptoms worsen or fail to improve, for Already has an appointment in October.  Counseling provided for all of the vaccine components No orders of the defined types were placed in this encounter.   Caryl Pina, MD [facility] 03/02/2021, 2:38 PM

## 2021-03-21 DIAGNOSIS — L03122 Acute lymphangitis of left axilla: Secondary | ICD-10-CM | POA: Diagnosis not present

## 2021-03-21 DIAGNOSIS — M79672 Pain in left foot: Secondary | ICD-10-CM | POA: Diagnosis not present

## 2021-03-22 DIAGNOSIS — R238 Other skin changes: Secondary | ICD-10-CM | POA: Diagnosis not present

## 2021-03-22 DIAGNOSIS — M7989 Other specified soft tissue disorders: Secondary | ICD-10-CM | POA: Diagnosis not present

## 2021-03-22 DIAGNOSIS — R2242 Localized swelling, mass and lump, left lower limb: Secondary | ICD-10-CM | POA: Diagnosis not present

## 2021-03-23 DIAGNOSIS — M79672 Pain in left foot: Secondary | ICD-10-CM | POA: Diagnosis not present

## 2021-03-23 DIAGNOSIS — M25572 Pain in left ankle and joints of left foot: Secondary | ICD-10-CM | POA: Diagnosis not present

## 2021-04-04 DIAGNOSIS — M76822 Posterior tibial tendinitis, left leg: Secondary | ICD-10-CM | POA: Diagnosis not present

## 2021-04-25 DIAGNOSIS — M76822 Posterior tibial tendinitis, left leg: Secondary | ICD-10-CM | POA: Diagnosis not present

## 2021-05-04 ENCOUNTER — Encounter: Payer: Self-pay | Admitting: Family Medicine

## 2021-05-04 ENCOUNTER — Other Ambulatory Visit: Payer: Self-pay

## 2021-05-04 ENCOUNTER — Ambulatory Visit (INDEPENDENT_AMBULATORY_CARE_PROVIDER_SITE_OTHER): Payer: Medicare Other | Admitting: Family Medicine

## 2021-05-04 VITALS — BP 144/66 | HR 75 | Ht 73.0 in | Wt 174.0 lb

## 2021-05-04 DIAGNOSIS — K219 Gastro-esophageal reflux disease without esophagitis: Secondary | ICD-10-CM | POA: Diagnosis not present

## 2021-05-04 DIAGNOSIS — Z23 Encounter for immunization: Secondary | ICD-10-CM

## 2021-05-04 DIAGNOSIS — E039 Hypothyroidism, unspecified: Secondary | ICD-10-CM | POA: Diagnosis not present

## 2021-05-04 DIAGNOSIS — E782 Mixed hyperlipidemia: Secondary | ICD-10-CM

## 2021-05-04 NOTE — Progress Notes (Signed)
BP (!) 144/66   Pulse 75   Ht 6\' 1"  (1.854 m)   Wt 174 lb (78.9 kg)   SpO2 99%   BMI 22.96 kg/m    Subjective:   Patient ID: Christian Johnson, male    DOB: 24-Feb-1941, 80 y.o.   MRN: [id number]  HPI: Christian Johnson is a 80 y.o. male presenting on 05/04/2021 for Medical Management of Chronic Issues and Hypothyroidism   HPI Hypothyroidism recheck Patient is coming in for thyroid recheck today as well. They deny any issues with hair changes or heat or cold problems or diarrhea or constipation. They deny any chest pain or palpitations. They are currently on levothyroxine 125 micrograms, looks like maybe he was supposed to get the 100 but he said that the pharmacy last time they refill the 125.  Hyperlipidemia Patient is coming in for recheck of his hyperlipidemia. The patient is currently taking atorvastatin and fish oils. They deny any issues with myalgias or history of liver damage from it. They deny any focal numbness or weakness or chest pain.   GERD Patient is currently on omeprazole.  She denies any major symptoms or abdominal pain or belching or burping. She denies any blood in her stool or lightheadedness or dizziness.   Relevant past medical, surgical, family and social history reviewed and updated as indicated. Interim medical history since our last visit reviewed. Allergies and medications reviewed and updated.  Review of Systems  Constitutional:  Negative for chills and fever.  Eyes:  Negative for visual disturbance.  Respiratory:  Negative for shortness of breath and wheezing.   Cardiovascular:  Negative for chest pain and leg swelling.  Musculoskeletal:  Negative for back pain and gait problem.  Skin:  Negative for rash.  Neurological:  Negative for dizziness and weakness.  All other systems reviewed and are negative.  Per HPI unless specifically indicated above   Allergies as of 05/04/2021   No Known Allergies      Medication List        Accurate as of  May 04, 2021  1:17 PM. If you have any questions, ask your nurse or doctor.          atorvastatin 20 MG tablet Commonly known as: LIPITOR Take 1 tablet (20 mg total) by mouth daily.   calcium-vitamin D 500-200 MG-UNIT tablet Commonly known as: OSCAL WITH D Take 1 tablet by mouth daily.   Fish Oil 500 MG Caps Take 500 mg by mouth daily.   GenTeal Mild 0.2 % Soln Generic drug: Hypromellose Place 1 drop into both eyes daily.   levothyroxine 100 MCG tablet Commonly known as: SYNTHROID Take 1 tablet (100 mcg total) by mouth daily.   meclizine 12.5 MG tablet Commonly known as: ANTIVERT Take 1 tablet (12.5 mg total) by mouth 3 (three) times daily as needed for dizziness.   memantine 21 MG Cp24 24 hr capsule Commonly known as: NAMENDA XR Take 1 capsule (21 mg total) by mouth daily.   multivitamin with minerals Tabs tablet Take 1 tablet by mouth daily. Centrum Silver   naproxen sodium 220 MG tablet Commonly known as: ALEVE Take 220 mg by mouth daily as needed (for pain or headache).   omeprazole 20 MG capsule Commonly known as: PRILOSEC Take 1 capsule (20 mg total) by mouth daily.   vitamin B-12 500 MCG tablet Commonly known as: CYANOCOBALAMIN Take 500 mcg by mouth daily.         Objective:   BP Marland Kitchen)  144/66   Pulse 75   Ht 6\' 1"  (1.854 m)   Wt 174 lb (78.9 kg)   SpO2 99%   BMI 22.96 kg/m   Wt Readings from Last 3 Encounters:  05/04/21 174 lb (78.9 kg)  03/02/21 171 lb (77.6 kg)  01/19/21 169 lb 12.8 oz (77 kg)    Physical Exam Vitals and nursing note reviewed.  Constitutional:      General: He is not in acute distress.    Appearance: He is well-developed. He is not diaphoretic.  Eyes:     General: No scleral icterus.    Conjunctiva/sclera: Conjunctivae normal.  Neck:     Thyroid: No thyromegaly.  Cardiovascular:     Rate and Rhythm: Normal rate and regular rhythm.     Heart sounds: Normal heart sounds. No murmur heard. Pulmonary:     Effort:  Pulmonary effort is normal. No respiratory distress.     Breath sounds: Normal breath sounds. No wheezing.  Musculoskeletal:     Cervical back: Neck supple.  Lymphadenopathy:     Cervical: No cervical adenopathy.  Skin:    General: Skin is warm and dry.     Findings: No rash.  Neurological:     Mental Status: He is alert and oriented to person, place, and time.     Coordination: Coordination normal.  Psychiatric:        Behavior: Behavior normal.      Assessment & Plan:   Problem List Items Addressed This Visit       Digestive   GERD (gastroesophageal reflux disease)     Endocrine   Hypothyroidism - Primary   Relevant Orders   TSH     Other   Hyperlipidemia   Relevant Orders   Lipid panel    Will check blood work for thyroid, patient is actually taking 125 mcg and not 100 mcg, this may been the previous prescription that they still had on file from the pharmacy.  We will decide what his dose needs to be based on his blood work. Follow up plan: Return in about 3 months (around 08/04/2021), or if symptoms worsen or fail to improve, for Thyroid and cholesterol recheck.  Counseling provided for all of the vaccine components Orders Placed This Encounter  Procedures   Lipid panel   TSH    Caryl Pina, MD [facility] 05/04/2021, 1:18 PM

## 2021-05-05 LAB — TSH: TSH: 2.41 u[IU]/mL (ref 0.450–4.500)

## 2021-05-05 LAB — LIPID PANEL
Chol/HDL Ratio: 2.2 ratio (ref 0.0–5.0)
Cholesterol, Total: 129 mg/dL (ref 100–199)
HDL: 59 mg/dL (ref >39)
LDL Chol Calc (NIH): 48 mg/dL (ref 0–99)
Triglycerides: 125 mg/dL (ref 0–149)
VLDL Cholesterol Cal: 22 mg/dL (ref 5–40)

## 2021-05-15 DIAGNOSIS — L570 Actinic keratosis: Secondary | ICD-10-CM | POA: Diagnosis not present

## 2021-05-15 DIAGNOSIS — X32XXXD Exposure to sunlight, subsequent encounter: Secondary | ICD-10-CM | POA: Diagnosis not present

## 2021-05-15 DIAGNOSIS — C44329 Squamous cell carcinoma of skin of other parts of face: Secondary | ICD-10-CM | POA: Diagnosis not present

## 2021-06-15 DIAGNOSIS — Z08 Encounter for follow-up examination after completed treatment for malignant neoplasm: Secondary | ICD-10-CM | POA: Diagnosis not present

## 2021-06-15 DIAGNOSIS — Z85828 Personal history of other malignant neoplasm of skin: Secondary | ICD-10-CM | POA: Diagnosis not present

## 2021-06-15 DIAGNOSIS — X32XXXD Exposure to sunlight, subsequent encounter: Secondary | ICD-10-CM | POA: Diagnosis not present

## 2021-06-15 DIAGNOSIS — L570 Actinic keratosis: Secondary | ICD-10-CM | POA: Diagnosis not present

## 2021-07-05 ENCOUNTER — Other Ambulatory Visit: Payer: Self-pay | Admitting: Family Medicine

## 2021-07-05 DIAGNOSIS — E039 Hypothyroidism, unspecified: Secondary | ICD-10-CM

## 2021-08-04 ENCOUNTER — Encounter: Payer: Self-pay | Admitting: Family Medicine

## 2021-08-04 ENCOUNTER — Ambulatory Visit (INDEPENDENT_AMBULATORY_CARE_PROVIDER_SITE_OTHER): Payer: Medicare Other | Admitting: Family Medicine

## 2021-08-04 VITALS — BP 151/73 | HR 72 | Ht 73.0 in | Wt 180.0 lb

## 2021-08-04 DIAGNOSIS — K219 Gastro-esophageal reflux disease without esophagitis: Secondary | ICD-10-CM

## 2021-08-04 DIAGNOSIS — Z23 Encounter for immunization: Secondary | ICD-10-CM | POA: Diagnosis not present

## 2021-08-04 DIAGNOSIS — F03B0 Unspecified dementia, moderate, without behavioral disturbance, psychotic disturbance, mood disturbance, and anxiety: Secondary | ICD-10-CM | POA: Diagnosis not present

## 2021-08-04 DIAGNOSIS — E039 Hypothyroidism, unspecified: Secondary | ICD-10-CM

## 2021-08-04 DIAGNOSIS — E782 Mixed hyperlipidemia: Secondary | ICD-10-CM | POA: Diagnosis not present

## 2021-08-04 NOTE — Progress Notes (Signed)
BP (!) 151/73    Pulse 72    Ht '6\' 1"'  (1.854 m)    Wt 180 lb (81.6 kg)    SpO2 97%    BMI 23.75 kg/m    Subjective:   Patient ID: Christian Johnson, male    DOB: 1940-10-08, 81 y.o.   MRN: [id number]  HPI: Christian Johnson is a 80 y.o. male presenting on 08/04/2021 for Medical Management of Chronic Issues and Hypothyroidism   HPI Hypothyroidism recheck Patient is coming in for thyroid recheck today as well. They deny any issues with hair changes or heat or cold problems or diarrhea or constipation. They deny any chest pain or palpitations. They are currently on levothyroxine 100 micrograms   GERD Patient is currently on omeprazole.  She denies any major symptoms or abdominal pain or belching or burping. She denies any blood in her stool or lightheadedness or dizziness.   Dementia recheck Patient has dementia and is taking Namenda and is doing okay on it.  It is moderate but his wife does feel like there has been some benefit to it.  Do not want to change at this point.  Hyperlipidemia Patient is coming in for recheck of his hyperlipidemia. The patient is currently taking fish oil and atorvastatin. They deny any issues with myalgias or history of liver damage from it. They deny any focal numbness or weakness or chest pain.   Relevant past medical, surgical, family and social history reviewed and updated as indicated. Interim medical history since our last visit reviewed. Allergies and medications reviewed and updated.  Review of Systems  Constitutional:  Negative for chills and fever.  Eyes:  Negative for visual disturbance.  Respiratory:  Negative for shortness of breath and wheezing.   Cardiovascular:  Negative for chest pain and leg swelling.  Musculoskeletal:  Negative for back pain and gait problem.  Skin:  Negative for rash.  Neurological:  Negative for dizziness, weakness and light-headedness.  All other systems reviewed and are negative.  Per HPI unless specifically indicated  above   Allergies as of 08/04/2021   No Known Allergies      Medication List        Accurate as of August 04, 2021 11:51 AM. If you have any questions, ask your nurse or doctor.          atorvastatin 20 MG tablet Commonly known as: LIPITOR Take 1 tablet (20 mg total) by mouth daily.   calcium-vitamin D 500-200 MG-UNIT tablet Commonly known as: OSCAL WITH D Take 1 tablet by mouth daily.   Fish Oil 500 MG Caps Take 500 mg by mouth daily.   GenTeal Mild 0.2 % Soln Generic drug: Hypromellose Place 1 drop into both eyes daily.   levothyroxine 100 MCG tablet Commonly known as: SYNTHROID Take 1 tablet (100 mcg total) by mouth daily.   meclizine 12.5 MG tablet Commonly known as: ANTIVERT Take 1 tablet (12.5 mg total) by mouth 3 (three) times daily as needed for dizziness.   memantine 21 MG Cp24 24 hr capsule Commonly known as: NAMENDA XR Take 1 capsule (21 mg total) by mouth daily.   multivitamin with minerals Tabs tablet Take 1 tablet by mouth daily. Centrum Silver   naproxen sodium 220 MG tablet Commonly known as: ALEVE Take 220 mg by mouth daily as needed (for pain or headache).   omeprazole 20 MG capsule Commonly known as: PRILOSEC Take 1 capsule (20 mg total) by mouth daily.   vitamin B-12  500 MCG tablet Commonly known as: CYANOCOBALAMIN Take 500 mcg by mouth daily.         Objective:   BP (!) 151/73    Pulse 72    Ht '6\' 1"'  (1.854 m)    Wt 180 lb (81.6 kg)    SpO2 97%    BMI 23.75 kg/m   Wt Readings from Last 3 Encounters:  08/04/21 180 lb (81.6 kg)  05/04/21 174 lb (78.9 kg)  03/02/21 171 lb (77.6 kg)    Physical Exam Vitals and nursing note reviewed.  Constitutional:      General: He is not in acute distress.    Appearance: He is well-developed. He is not diaphoretic.  Eyes:     General: No scleral icterus.    Conjunctiva/sclera: Conjunctivae normal.  Neck:     Thyroid: No thyromegaly.  Cardiovascular:     Rate and Rhythm: Normal  rate and regular rhythm.     Heart sounds: Normal heart sounds. No murmur heard. Pulmonary:     Effort: Pulmonary effort is normal. No respiratory distress.     Breath sounds: Normal breath sounds. No wheezing.  Musculoskeletal:        General: No swelling. Normal range of motion.     Cervical back: Neck supple.  Lymphadenopathy:     Cervical: No cervical adenopathy.  Skin:    General: Skin is warm and dry.     Findings: No rash.  Neurological:     Mental Status: He is alert and oriented to person, place, and time.     Coordination: Coordination normal.  Psychiatric:        Behavior: Behavior normal.      Assessment & Plan:   Problem List Items Addressed This Visit       Digestive   GERD (gastroesophageal reflux disease)   Relevant Orders   CMP14+EGFR     Endocrine   Hypothyroidism - Primary   Relevant Orders   TSH     Nervous and Auditory   Dementia ([place])     Other   Hyperlipidemia   Relevant Orders   Lipid panel    Continue current medicine, no changes. Follow up plan: Return in about 6 months (around 02/01/2022), or if symptoms worsen or fail to improve, for Thyroid recheck.  Counseling provided for all of the vaccine components Orders Placed This Encounter  Procedures   CMP14+EGFR   Lipid panel   TSH    Caryl Pina, MD [facility] 08/04/2021, 11:51 AM

## 2021-08-04 NOTE — Addendum Note (Signed)
Addended by: Alphonzo Dublin on: 08/04/2021 12:01 PM   Modules accepted: Orders

## 2021-08-05 LAB — CMP14+EGFR
ALT: 18 IU/L (ref 0–44)
AST: 26 IU/L (ref 0–40)
Albumin/Globulin Ratio: 1.6 (ref 1.2–2.2)
Albumin: 3.8 g/dL (ref 3.7–4.7)
Alkaline Phosphatase: 141 IU/L — ABNORMAL HIGH (ref 44–121)
BUN/Creatinine Ratio: 17 (ref 10–24)
BUN: 16 mg/dL (ref 8–27)
Bilirubin Total: 0.9 mg/dL (ref 0.0–1.2)
CO2: 28 mmol/L (ref 20–29)
Calcium: 9.2 mg/dL (ref 8.6–10.2)
Chloride: 105 mmol/L (ref 96–106)
Creatinine, Ser: 0.94 mg/dL (ref 0.76–1.27)
Globulin, Total: 2.4 g/dL (ref 1.5–4.5)
Glucose: 89 mg/dL (ref 70–99)
Potassium: 4.7 mmol/L (ref 3.5–5.2)
Sodium: 144 mmol/L (ref 134–144)
Total Protein: 6.2 g/dL (ref 6.0–8.5)
eGFR: 82 mL/min/{1.73_m2} (ref >59)

## 2021-08-05 LAB — LIPID PANEL
Chol/HDL Ratio: 2.5 ratio (ref 0.0–5.0)
Cholesterol, Total: 132 mg/dL (ref 100–199)
HDL: 52 mg/dL (ref >39)
LDL Chol Calc (NIH): 52 mg/dL (ref 0–99)
Triglycerides: 169 mg/dL — ABNORMAL HIGH (ref 0–149)
VLDL Cholesterol Cal: 28 mg/dL (ref 5–40)

## 2021-08-05 LAB — TSH: TSH: 0.143 u[IU]/mL — ABNORMAL LOW (ref 0.450–4.500)

## 2021-08-21 ENCOUNTER — Other Ambulatory Visit: Payer: Self-pay | Admitting: Family Medicine

## 2021-08-21 DIAGNOSIS — E039 Hypothyroidism, unspecified: Secondary | ICD-10-CM

## 2021-08-21 MED ORDER — LEVOTHYROXINE SODIUM 100 MCG PO TABS: 100.0000 ug | ORAL_TABLET | Freq: Every day | ORAL | 3 refills | Status: DC

## 2021-08-21 NOTE — Telephone Encounter (Signed)
Sent refill for levothyroxine 100 mcg daily

## 2021-08-21 NOTE — Telephone Encounter (Signed)
RF request came in to refill 112 mcg Levothyroxine but I noted this was changed in July 2022. TC to wife after seeing lab result notes from 08/04/21.  Pt did go down to the 100 mcg of levothyroxine but ran out and started taking the 112 mcg again, so results are base on him taking the 112 mcg currently Please advise on what strength to change to.  Do you still want him to decrease to 88 mcg daily as per lab result notes

## 2021-08-23 ENCOUNTER — Ambulatory Visit (INDEPENDENT_AMBULATORY_CARE_PROVIDER_SITE_OTHER): Payer: Medicare Other

## 2021-08-23 VITALS — Wt 180.0 lb

## 2021-08-23 DIAGNOSIS — Z0000 Encounter for general adult medical examination without abnormal findings: Secondary | ICD-10-CM

## 2021-08-23 NOTE — Progress Notes (Signed)
Subjective:   Christian Johnson is a 81 y.o. male who presents for Medicare Annual/Subsequent preventive examination.  Virtual Visit via Telephone Note  I connected with  Christian Johnson on 08/23/21 at  8:15 AM EST by telephone and verified that I am speaking with the correct person using two identifiers.  Location: Patient: Home Provider: [facility] Persons participating in the virtual visit: patient and wife Christian Johnson Advisor   I discussed the limitations, risks, security and privacy concerns of performing an evaluation and management service by telephone and the availability of in person appointments. The patient expressed understanding and agreed to proceed.  Interactive audio and video telecommunications were attempted between this nurse and patient, however failed, due to patient having technical difficulties OR patient did not have access to video capability.  We continued and completed visit with audio only.  Some vital signs may be absent or patient reported.   Kayin Osment E Colleen Kotlarz, LPN   Review of Systems     Cardiac Risk Factors include: advanced age (>68men, >70 women);male gender;sedentary lifestyle;dyslipidemia     Objective:    Today's Vitals   08/23/21 0819  Weight: 180 lb (81.6 kg)   Body mass index is 23.75 kg/m.  Advanced Directives 08/23/2021 08/22/2020 08/20/2019 03/09/2019 02/24/2019 11/06/2017 01/31/2012  Does Patient Have a Medical Advance Directive? No No No No No Yes Patient has advance directive, copy not in chart  Type of Advance Directive - - - - - Press photographer;Living will Living will  Copy of [place] in Chart? - - - - - No - copy requested -  Would patient like information on creating a medical advance directive? No - Patient declined No - Patient declined No - Patient declined No - Patient declined No - Patient declined - -  Pre-existing out of facility DNR order (yellow form or pink MOST form) - - - - - - No    Current  Medications (verified) Outpatient Encounter Medications as of 08/23/2021  Medication Sig   atorvastatin (LIPITOR) 20 MG tablet Take 1 tablet (20 mg total) by mouth daily.   calcium-vitamin D (OSCAL WITH D) 500-200 MG-UNIT tablet Take 1 tablet by mouth daily.   GENTEAL MILD 0.2 % SOLN Place 1 drop into both eyes daily.   levothyroxine (SYNTHROID) 100 MCG tablet Take 1 tablet (100 mcg total) by mouth daily.   meclizine (ANTIVERT) 12.5 MG tablet Take 1 tablet (12.5 mg total) by mouth 3 (three) times daily as needed for dizziness.   memantine (NAMENDA XR) 21 MG CP24 24 hr capsule Take 1 capsule (21 mg total) by mouth daily.   Multiple Vitamin (MULTIVITAMIN WITH MINERALS) TABS Take 1 tablet by mouth daily. Centrum Silver   naproxen sodium (ALEVE) 220 MG tablet Take 220 mg by mouth daily as needed (for pain or headache).    Omega-3 Fatty Acids (FISH OIL) 500 MG CAPS Take 500 mg by mouth daily.   omeprazole (PRILOSEC) 20 MG capsule Take 1 capsule (20 mg total) by mouth daily.   vitamin B-12 (CYANOCOBALAMIN) 500 MCG tablet Take 500 mcg by mouth daily.   No facility-administered encounter medications on file as of 08/23/2021.    Allergies (verified) Patient has no known allergies.   History: Past Medical History:  Diagnosis Date   Cancer [facility])    Prostate   Cataract    Colon polyps    GERD (gastroesophageal reflux disease)    Hyperlipidemia    Hypothyroidism  Memory change    Prostate cancer [facility])    Past Surgical History:  Procedure Laterality Date   CATARACT EXTRACTION W/PHACO Left 03/09/2019   Procedure: CATARACT EXTRACTION PHACO AND INTRAOCULAR LENS PLACEMENT (IOC);  Surgeon: Baruch Goldmann, MD;  Location: [facility] OR[facility];  Service: Ophthalmology;  Laterality: Left;  left, CDE: 7.70   COLONOSCOPY  01/31/2012   Procedure: COLONOSCOPY;  Surgeon: Rogene Houston, MD;  Location: [facility] END[facility];  Service: Endoscopy;  Laterality: N/A;  930   COLONOSCOPY N/A 11/06/2017   Procedure: COLONOSCOPY;   Surgeon: Rogene Houston, MD;  Location: [facility];  Service: Endoscopy;  Laterality: N/A;  830   Colonoscopy with polyp removal     EYE SURGERY Left 07/08/2018   cataracts   POLYPECTOMY  11/06/2017   Procedure: POLYPECTOMY;  Surgeon: Rogene Houston, MD;  Location: [facility];  Service: Endoscopy;;  cecal (CB), transverse colon (CB), rectal polyp (HS)   Right hip core decompression     Family History  Problem Relation Age of Onset   Breast cancer Mother    Cancer Father        Lung   Hypertension Son    Kidney disease Son    Kidney cancer Son    Colon cancer Neg Hx    Social History   Socioeconomic History   Marital status: Married    Spouse name: Christian Johnson   Number of children: 2   Years of education: 12   Highest education level: High school graduate  Occupational History   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  Tobacco Use   Smoking status: Former    Packs/day: 1.00    Years: 25.00    Pack years: 25.00    Types: Cigarettes    Quit date: 02/24/1999    Years since quitting: 22.5   Smokeless tobacco: Never  Vaping Use   Vaping Use: Never used  Substance and Sexual Activity   Alcohol use: No   Drug use: No   Sexual activity: Not Currently  Other Topics Concern   Not on file  Social History Narrative   Lives with wife - has basement   Son and daughter live on same street as them   Social Determinants of Health   Financial Resource Strain: Low Risk    Difficulty of Paying Living Expenses: Not hard at all  Food Insecurity: No Food Insecurity   Worried About Charity fundraiser in the Last Year: Never true   Arboriculturist in the Last Year: Never true  Transportation Needs: No Transportation Needs   Lack of Transportation (Medical): No   Lack of Transportation (Non-Medical): No  Physical Activity: Insufficiently Active   Days of Exercise per Week: 7 days   Minutes of Exercise per Session: 20 min  Stress: No Stress Concern Present   Feeling of  Stress : Only a little  Social Connections: Engineer, building services of Communication with Friends and Family: More than three times a week   Frequency of Social Gatherings with Friends and Family: More than three times a week   Attends Religious Services: More than 4 times per year   Active Member of [employer] or Organizations: Yes   Attends Music therapist: More than 4 times per year   Marital Status: Married    Tobacco Counseling Counseling given: Not Answered   Clinical Intake:  Pre-visit preparation completed: Yes  Pain : No/denies pain  BMI - recorded: 23.75 Nutritional Status: BMI of 19-24  Normal Nutritional Risks: None Diabetes: No  How often do you need to have someone help you when you read instructions, pamphlets, or other written materials from your doctor or pharmacy?: 1 - Never  Diabetic? no  Interpreter Needed?: No  Information entered by :: Jadira Nierman, LPN   Activities of Daily Living In your present state of health, do you have any difficulty performing the following activities: 08/23/2021  Hearing? Y  Comment wears hearing aids  Vision? N  Difficulty concentrating or making decisions? Y  Walking or climbing stairs? N  Dressing or bathing? N  Doing errands, shopping? N  Preparing Food and eating ? N  Using the Toilet? N  In the past six months, have you accidently leaked urine? N  Do you have problems with loss of bowel control? N  Managing your Medications? Y  Comment wife reminds him  Managing your Finances? Y  Housekeeping or managing your Housekeeping? N  Some recent data might be hidden    Patient Care Team: Dettinger, Fransisca Kaufmann, MD as PCP - General (Family Medicine)  Indicate any recent Medical Services you may have received from other than [facility] providers in the past year (date may be approximate).     Assessment:   This is a routine wellness examination for [place].  Hearing/Vision screen Hearing Screening -  Comments:: Wears hearing aids - from Audiologist in [employer] - Comments:: Wears rx glasses - up to date with routine eye exams with [facility]  Dietary issues and exercise activities discussed: Current Exercise Habits: Home exercise routine, Type of exercise: walking, Time (Minutes): 20, Frequency (Times/Week): 7, Weekly Exercise (Minutes/Week): 140, Intensity: Mild, Exercise limited by: psychological condition(s)   Goals Addressed             This Visit's Progress    AWV   On track    08/20/2019 AWV Goal: Fall Prevention  Over the next year, patient will decrease their risk for falls by: Using assistive devices, such as a cane or walker, as needed Identifying fall risks within their home and correcting them by: Removing throw rugs Adding handrails to stairs or ramps Removing clutter and keeping a clear pathway throughout the home Increasing light, especially at night Adding shower handles/bars Raising toilet seat Identifying potential personal risk factors for falls: Medication side effects Incontinence/urgency Vestibular dysfunction Hearing loss Musculoskeletal disorders Neurological disorders Orthostatic hypotension  08/20/2019 AWV Goal: Exercise for General Health  Patient will verbalize understanding of the benefits of increased physical activity: Exercising regularly is important. It will improve your overall fitness, flexibility, and endurance. Regular exercise also will improve your overall health. It can help you control your weight, reduce stress, and improve your bone density. Over the next year, patient will increase physical activity as tolerated with a goal of at least 150 minutes of moderate physical activity per week.  You can tell that you are exercising at a moderate intensity if your heart starts beating faster and you start breathing faster but can still hold a conversation. Moderate-intensity exercise ideas include: Walking 1 mile (1.6  km) in about 15 minutes Biking Hiking Golfing Dancing Water aerobics Patient will verbalize understanding of everyday activities that increase physical activity by providing examples like the following: Yard work, such as: Sales promotion account executive Gardening Washing windows or floors Patient will be able to explain general safety  guidelines for exercising:  Before you start a new exercise program, talk with your health care provider. Do not exercise so much that you hurt yourself, feel dizzy, or get very short of breath. Wear comfortable clothes and wear shoes with good support. Drink plenty of water while you exercise to prevent dehydration or heat stroke. Work out until your breathing and your heartbeat get faster.        Exercise 150 min/wk Moderate Activity   On track      Depression Screen PHQ 2/9 Scores 08/23/2021 08/04/2021 05/04/2021 03/02/2021 01/19/2021 10/31/2020 08/26/2020  PHQ - 2 Score 2 2 2 2 2 1 1   PHQ- 9 Score 4 5 6 7 8  - 4    Fall Risk Fall Risk  08/23/2021 08/04/2021 05/04/2021 03/02/2021 01/19/2021  Falls in the past year? 0 0 0 0 0  Number falls in past yr: 0 - - - -  Injury with Fall? 0 - - - -  Risk for fall due to : Mental status change - - - -  Follow up Falls prevention discussed - - - -    FALL RISK PREVENTION PERTAINING TO THE HOME:  Any stairs in or around the home? Yes  If so, are there any without handrails? No  Home free of loose throw rugs in walkways, pet beds, electrical cords, etc? Yes  Adequate lighting in your home to reduce risk of falls? Yes   ASSISTIVE DEVICES UTILIZED TO PREVENT FALLS:  Life alert? No  Use of a cane, walker or w/c? No  Grab bars in the bathroom? No  Shower chair or bench in shower? Yes  Elevated toilet seat or a handicapped toilet? No   TIMED UP AND GO:  Was the test performed? No . Telephonic visit  Cognitive Function: MMSE - Mini Mental  State Exam 08/23/2021 10/31/2020 12/25/2019 04/10/2019  Not completed: Unable to complete - - -  Orientation to time - 3 5 5   Orientation to Place - 2 2 3   Registration - 3 3 3   Attention/ Calculation - 4 0 3  Recall - 3 1 0  Language- name 2 objects - 2 2 2   Language- repeat - 1 1 1   Language- follow 3 step command - 2 3 3   Language- read & follow direction - 1 1 1   Write a sentence - 1 1 1   Copy design - 1 1 1   Total score - 23 20 23      6CIT Screen 08/22/2020 08/20/2019 02/24/2019  What Year? 0 points 0 points 0 points  What month? 0 points 0 points 0 points  What time? 0 points 0 points 0 points  Count back from 20 0 points 0 points 0 points  Months in reverse 4 points 4 points 4 points  Repeat phrase 10 points 6 points 4 points  Total Score 14 10 8     Immunizations Immunization History  Administered Date(s) Administered   Fluad Quad(high Dose 65+) 04/10/2019, 04/20/2020, 05/04/2021   Influenza, High Dose Seasonal PF 03/17/2015, 04/18/2016, 04/23/2017, 04/15/2018   Influenza-Unspecified 04/18/2016, 04/23/2017, 04/15/2018   Moderna SARS-COV2 Booster Vaccination 11/11/2019   Moderna Sars-Covid-2 Vaccination 10/14/2019, 11/11/2019   Pneumococcal Conjugate-13 07/27/2015   Pneumococcal Polysaccharide-23 12/19/2016   Tdap 01/01/2011, 08/04/2021    TDAP status: Up to date  Flu Vaccine status: Up to date  Pneumococcal vaccine status: Up to date  Covid-19 vaccine status: Completed vaccines  Qualifies for Shingles Vaccine? Yes   Zostavax completed No   Shingrix  Completed?: No.    Education has been provided regarding the importance of this vaccine. Patient has been advised to call insurance company to determine out of pocket expense if they have not yet received this vaccine. Advised may also receive vaccine at local pharmacy or Health Dept. Verbalized acceptance and understanding.  Screening Tests Health Maintenance  Topic Date Due   Zoster Vaccines- Shingrix (1 of 2) Never  done   COVID-19 Vaccine (3 - Moderna risk series) 12/09/2019   COLONOSCOPY (Pts 45-83yrs Insurance coverage will need to be confirmed)  11/07/2022   TETANUS/TDAP  08/05/2031   Pneumonia Vaccine 110+ Years old  Completed   INFLUENZA VACCINE  Completed   HPV VACCINES  Aged Out   Hepatitis C Screening  Discontinued    Health Maintenance  Health Maintenance Due  Topic Date Due   Zoster Vaccines- Shingrix (1 of 2) Never done   COVID-19 Vaccine (3 - Moderna risk series) 12/09/2019    Colorectal cancer screening: Type of screening: Colonoscopy. Completed 11/06/2017. Repeat every 5 years  Lung Cancer Screening: (Low Dose CT Chest recommended if Age 76-80 years, 30 pack-year currently smoking OR have quit w/in 15years.) does not qualify.   Additional Screening:  Hepatitis C Screening: does not qualify  Vision Screening: Recommended annual ophthalmology exams for early detection of glaucoma and other disorders of the eye. Is the patient up to date with their annual eye exam?  Yes  Who is the provider or what is the name of the office in which the patient attends annual eye exams? [facility] If pt is not established with a provider, would they like to be referred to a provider to establish care? No .   Dental Screening: Recommended annual dental exams for proper oral hygiene  Community Resource Referral / Chronic Care Management: CRR required this visit?  No   CCM required this visit?  No      Plan:     I have personally reviewed and noted the following in the patients chart:   Medical and social history Use of alcohol, tobacco or illicit drugs  Current medications and supplements including opioid prescriptions. Patient is not currently taking opioid prescriptions. Functional ability and status Nutritional status Physical activity Advanced directives List of other physicians Hospitalizations, surgeries, and ER visits in previous 12 months Vitals Screenings to include  cognitive, depression, and falls Referrals and appointments  In addition, I have reviewed and discussed with patient certain preventive protocols, quality metrics, and best practice recommendations. A written personalized care plan for preventive services as well as general preventive health recommendations were provided to patient.   Due to this being a telephonic visit, the after visit summary with patients personalized plan was offered to patient via mail or my-chart. Patient declined at this time.  Sandrea Hammond, LPN   09/17/6699   Nurse Notes: None

## 2021-08-23 NOTE — Patient Instructions (Signed)
Christian Johnson , Thank you for taking time to come for your Medicare Wellness Visit. I appreciate your ongoing commitment to your health goals. Please review the following plan we discussed and let me know if I can assist you in the future.   Screening recommendations/referrals: Colonoscopy: Done 11/06/2017 - Repeat in 5 years  Recommended yearly ophthalmology/optometry visit for glaucoma screening and checkup Recommended yearly dental visit for hygiene and checkup  Vaccinations: Influenza vaccine: Done 05/04/2021 - Repeat annually Pneumococcal vaccine: Done 07/27/2015 & 12/19/2016 Tdap vaccine: Done 08/04/2021 - Repeat in 10 years  Shingles vaccine: Due - recommend 2 doses 2-6 months apart   Covid-19: Done 10/14/2019 & 11/11/2019 - contact pharmacy for booster  Advanced directives: Advance directive discussed with you today. Even though you declined this today, please call our office should you change your mind, and we can give you the proper paperwork for you to fill out.   Conditions/risks identified: Aim for 30 minutes of exercise or brisk walking each day, drink 6-8 glasses of water and eat lots of fruits and vegetables.   Next appointment: Follow up in one year for your annual wellness visit.   Preventive Care 67 Years and Older, Male  Preventive care refers to lifestyle choices and visits with your health care provider that can promote health and wellness. What does preventive care include? A yearly physical exam. This is also called an annual well check. Dental exams once or twice a year. Routine eye exams. Ask your health care provider how often you should have your eyes checked. Personal lifestyle choices, including: Daily care of your teeth and gums. Regular physical activity. Eating a healthy diet. Avoiding tobacco and drug use. Limiting alcohol use. Practicing safe sex. Taking low doses of aspirin every day. Taking vitamin and mineral supplements as recommended by your health  care provider. What happens during an annual well check? The services and screenings done by your health care provider during your annual well check will depend on your age, overall health, lifestyle risk factors, and family history of disease. Counseling  Your health care provider may ask you questions about your: Alcohol use. Tobacco use. Drug use. Emotional well-being. Home and relationship well-being. Sexual activity. Eating habits. History of falls. Memory and ability to understand (cognition). Work and work Statistician. Screening  You may have the following tests or measurements: Height, weight, and BMI. Blood pressure. Lipid and cholesterol levels. These may be checked every 5 years, or more frequently if you are over 15 years old. Skin check. Lung cancer screening. You may have this screening every year starting at age 84 if you have a 30-pack-year history of smoking and currently smoke or have quit within the past 15 years. Fecal occult blood test (FOBT) of the stool. You may have this test every year starting at age 41. Flexible sigmoidoscopy or colonoscopy. You may have a sigmoidoscopy every 5 years or a colonoscopy every 10 years starting at age 20. Prostate cancer screening. Recommendations will vary depending on your family history and other risks. Hepatitis C blood test. Hepatitis B blood test. Sexually transmitted disease (STD) testing. Diabetes screening. This is done by checking your blood sugar (glucose) after you have not eaten for a while (fasting). You may have this done every 1-3 years. Abdominal aortic aneurysm (AAA) screening. You may need this if you are a current or former smoker. Osteoporosis. You may be screened starting at age 42 if you are at high risk. Talk with your health care provider  about your test results, treatment options, and if necessary, the need for more tests. Vaccines  Your health care provider may recommend certain vaccines, such  as: Influenza vaccine. This is recommended every year. Tetanus, diphtheria, and acellular pertussis (Tdap, Td) vaccine. You may need a Td booster every 10 years. Zoster vaccine. You may need this after age 59. Pneumococcal 13-valent conjugate (PCV13) vaccine. One dose is recommended after age 28. Pneumococcal polysaccharide (PPSV23) vaccine. One dose is recommended after age 16. Talk to your health care provider about which screenings and vaccines you need and how often you need them. This information is not intended to replace advice given to you by your health care provider. Make sure you discuss any questions you have with your health care provider. Document Released: 07/29/2015 Document Revised: 03/21/2016 Document Reviewed: 05/03/2015 Elsevier Interactive Patient Education  2017 [place] Prevention in the Home Falls can cause injuries. They can happen to people of all ages. There are many things you can do to make your home safe and to help prevent falls. What can I do on the outside of my home? Regularly fix the edges of walkways and driveways and fix any cracks. Remove anything that might make you trip as you walk through a door, such as a raised step or threshold. Trim any bushes or trees on the path to your home. Use bright outdoor lighting. Clear any walking paths of anything that might make someone trip, such as rocks or tools. Regularly check to see if handrails are loose or broken. Make sure that both sides of any steps have handrails. Any raised decks and porches should have guardrails on the edges. Have any leaves, snow, or ice cleared regularly. Use sand or salt on walking paths during winter. Clean up any spills in your garage right away. This includes oil or grease spills. What can I do in the bathroom? Use night lights. Install grab bars by the toilet and in the tub and shower. Do not use towel bars as grab bars. Use non-skid mats or decals in the tub or  shower. If you need to sit down in the shower, use a plastic, non-slip stool. Keep the floor dry. Clean up any water that spills on the floor as soon as it happens. Remove soap buildup in the tub or shower regularly. Attach bath mats securely with double-sided non-slip rug tape. Do not have throw rugs and other things on the floor that can make you trip. What can I do in the bedroom? Use night lights. Make sure that you have a light by your bed that is easy to reach. Do not use any sheets or blankets that are too big for your bed. They should not hang down onto the floor. Have a firm chair that has side arms. You can use this for support while you get dressed. Do not have throw rugs and other things on the floor that can make you trip. What can I do in the kitchen? Clean up any spills right away. Avoid walking on wet floors. Keep items that you use a lot in easy-to-reach places. If you need to reach something above you, use a strong step stool that has a grab bar. Keep electrical cords out of the way. Do not use floor polish or wax that makes floors slippery. If you must use wax, use non-skid floor wax. Do not have throw rugs and other things on the floor that can make you trip. What can I do  with my stairs? Do not leave any items on the stairs. Make sure that there are handrails on both sides of the stairs and use them. Fix handrails that are broken or loose. Make sure that handrails are as long as the stairways. Check any carpeting to make sure that it is firmly attached to the stairs. Fix any carpet that is loose or worn. Avoid having throw rugs at the top or bottom of the stairs. If you do have throw rugs, attach them to the floor with carpet tape. Make sure that you have a light switch at the top of the stairs and the bottom of the stairs. If you do not have them, ask someone to add them for you. What else can I do to help prevent falls? Wear shoes that: Do not have high heels. Have  rubber bottoms. Are comfortable and fit you well. Are closed at the toe. Do not wear sandals. If you use a stepladder: Make sure that it is fully opened. Do not climb a closed stepladder. Make sure that both sides of the stepladder are locked into place. Ask someone to hold it for you, if possible. Clearly mark and make sure that you can see: Any grab bars or handrails. First and last steps. Where the edge of each step is. Use tools that help you move around (mobility aids) if they are needed. These include: Canes. Walkers. Scooters. Crutches. Turn on the lights when you go into a dark area. Replace any light bulbs as soon as they burn out. Set up your furniture so you have a clear path. Avoid moving your furniture around. If any of your floors are uneven, fix them. If there are any pets around you, be aware of where they are. Review your medicines with your doctor. Some medicines can make you feel dizzy. This can increase your chance of falling. Ask your doctor what other things that you can do to help prevent falls. This information is not intended to replace advice given to you by your health care provider. Make sure you discuss any questions you have with your health care provider. Document Released: 04/28/2009 Document Revised: 12/08/2015 Document Reviewed: 08/06/2014 Elsevier Interactive Patient Education  2017 [employer].

## 2021-11-17 ENCOUNTER — Other Ambulatory Visit: Payer: Self-pay | Admitting: Family Medicine

## 2021-11-17 DIAGNOSIS — E039 Hypothyroidism, unspecified: Secondary | ICD-10-CM

## 2021-11-30 ENCOUNTER — Other Ambulatory Visit: Payer: Self-pay | Admitting: Family Medicine

## 2021-11-30 DIAGNOSIS — R413 Other amnesia: Secondary | ICD-10-CM

## 2021-11-30 DIAGNOSIS — F03918 Unspecified dementia, unspecified severity, with other behavioral disturbance: Secondary | ICD-10-CM

## 2021-12-08 ENCOUNTER — Other Ambulatory Visit: Payer: Self-pay | Admitting: Family Medicine

## 2021-12-08 DIAGNOSIS — E782 Mixed hyperlipidemia: Secondary | ICD-10-CM

## 2021-12-14 DIAGNOSIS — L570 Actinic keratosis: Secondary | ICD-10-CM | POA: Diagnosis not present

## 2021-12-14 DIAGNOSIS — X32XXXD Exposure to sunlight, subsequent encounter: Secondary | ICD-10-CM | POA: Diagnosis not present

## 2021-12-14 DIAGNOSIS — Z08 Encounter for follow-up examination after completed treatment for malignant neoplasm: Secondary | ICD-10-CM | POA: Diagnosis not present

## 2021-12-14 DIAGNOSIS — Z85828 Personal history of other malignant neoplasm of skin: Secondary | ICD-10-CM | POA: Diagnosis not present

## 2021-12-29 ENCOUNTER — Other Ambulatory Visit: Payer: Self-pay | Admitting: Family Medicine

## 2021-12-29 DIAGNOSIS — K219 Gastro-esophageal reflux disease without esophagitis: Secondary | ICD-10-CM

## 2022-01-01 ENCOUNTER — Encounter (HOSPITAL_COMMUNITY): Payer: Self-pay | Admitting: *Deleted

## 2022-01-01 ENCOUNTER — Emergency Department (HOSPITAL_COMMUNITY)
Admission: EM | Admit: 2022-01-01 | Discharge: 2022-01-02 | Disposition: A | Discharge status: Home / Self Care | Payer: Medicare Other | Attending: Emergency Medicine | Admitting: Emergency Medicine

## 2022-01-01 ENCOUNTER — Emergency Department (HOSPITAL_COMMUNITY): Payer: Medicare Other

## 2022-01-01 ENCOUNTER — Other Ambulatory Visit: Payer: Self-pay

## 2022-01-01 DIAGNOSIS — R52 Pain, unspecified: Secondary | ICD-10-CM | POA: Diagnosis not present

## 2022-01-01 DIAGNOSIS — Z041 Encounter for examination and observation following transport accident: Secondary | ICD-10-CM | POA: Diagnosis not present

## 2022-01-01 DIAGNOSIS — G4489 Other headache syndrome: Secondary | ICD-10-CM | POA: Diagnosis not present

## 2022-01-01 DIAGNOSIS — R6889 Other general symptoms and signs: Secondary | ICD-10-CM | POA: Diagnosis not present

## 2022-01-01 DIAGNOSIS — Z79899 Other long term (current) drug therapy: Secondary | ICD-10-CM | POA: Insufficient documentation

## 2022-01-01 DIAGNOSIS — Y92410 Unspecified street and highway as the place of occurrence of the external cause: Secondary | ICD-10-CM | POA: Insufficient documentation

## 2022-01-01 DIAGNOSIS — G319 Degenerative disease of nervous system, unspecified: Secondary | ICD-10-CM | POA: Diagnosis not present

## 2022-01-01 DIAGNOSIS — R42 Dizziness and giddiness: Secondary | ICD-10-CM | POA: Insufficient documentation

## 2022-01-01 DIAGNOSIS — M47812 Spondylosis without myelopathy or radiculopathy, cervical region: Secondary | ICD-10-CM | POA: Diagnosis not present

## 2022-01-01 DIAGNOSIS — M542 Cervicalgia: Secondary | ICD-10-CM | POA: Diagnosis not present

## 2022-01-01 DIAGNOSIS — S0990XA Unspecified injury of head, initial encounter: Secondary | ICD-10-CM | POA: Diagnosis not present

## 2022-01-01 DIAGNOSIS — M4312 Spondylolisthesis, cervical region: Secondary | ICD-10-CM | POA: Diagnosis not present

## 2022-01-01 DIAGNOSIS — Z743 Need for continuous supervision: Secondary | ICD-10-CM | POA: Diagnosis not present

## 2022-01-01 HISTORY — DX: Unspecified dementia, unspecified severity, without behavioral disturbance, psychotic disturbance, mood disturbance, and anxiety: F03.90
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
## 2022-01-02 NOTE — Discharge Instructions (Signed)
Reassuring that your imaging studies today showed no acute abnormality including brain bleed or fracture or dislocation.  You can take Tylenol/ibuprofen as needed for pain.  Note that your symptoms will probably get worse over the next day or 2 before they get better.  Your blood pressure was elevated today in the emergency department so I suggest close follow-up with PCP regarding your hypertension.  Please do not hesitate to return to the emergency department for worrisome signs and symptoms we discussed become apparent.

## 2022-01-11 ENCOUNTER — Encounter: Payer: Self-pay | Admitting: Family Medicine

## 2022-01-11 ENCOUNTER — Ambulatory Visit (INDEPENDENT_AMBULATORY_CARE_PROVIDER_SITE_OTHER): Payer: Medicare Other | Admitting: Family Medicine

## 2022-01-11 VITALS — BP 134/74 | HR 80 | Temp 98.4°F | Ht 73.0 in | Wt 181.0 lb

## 2022-01-11 DIAGNOSIS — E039 Hypothyroidism, unspecified: Secondary | ICD-10-CM | POA: Diagnosis not present

## 2022-01-11 DIAGNOSIS — K5900 Constipation, unspecified: Secondary | ICD-10-CM

## 2022-01-11 DIAGNOSIS — R400 Somnolence: Secondary | ICD-10-CM

## 2022-01-11 DIAGNOSIS — R5383 Other fatigue: Secondary | ICD-10-CM | POA: Diagnosis not present

## 2022-01-11 NOTE — Progress Notes (Signed)
BP 134/74   Pulse 80   Temp 98.4 F (36.9 C)   Ht _0  (1.854 m)   Wt 181 lb (82.1 kg)   SpO2 97%   BMI 23.88 kg/m    Subjective:   Patient ID: Christian Johnson, male    DOB: Sep 29, 1940, 81 y.o.   MRN: [id number]  HPI: Christian Johnson is a 81 y.o. male presenting on 01/11/2022 for Fatigue and Constipation   HPI Fatigue  Energy down and sluggish and stays tired all of the time. Daytime somnolence.  He is taking thyroid but does not know the dose he is taking.   Hypothyroidism recheck Patient is coming in for thyroid recheck today as well. They deny any issues with hair changes or heat or cold problems or diarrhea or constipation. They deny any chest pain or palpitations. They are currently on levothyroxine 100 or 112 micrograms cant remember  Patient has been having constipation as well and recommended that they try MiraLAX and senna with docusate.  He gets it off and on in the docusate does help some but it does not clear completely.  Relevant past medical, surgical, family and social history reviewed and updated as indicated. Interim medical history since our last visit reviewed. Allergies and medications reviewed and updated.  Review of Systems  Constitutional:  Negative for chills and fever.  Eyes:  Negative for visual disturbance.  Respiratory:  Negative for shortness of breath and wheezing.   Cardiovascular:  Negative for chest pain and leg swelling.  Musculoskeletal:  Negative for back pain and gait problem.  Skin:  Negative for rash.  Neurological:  Negative for dizziness, weakness and light-headedness.  Psychiatric/Behavioral:  Positive for decreased concentration and sleep disturbance. Negative for self-injury and suicidal ideas. The patient is nervous/anxious.   All other systems reviewed and are negative.   Per HPI unless specifically indicated above   Allergies as of 01/11/2022   No Known Allergies      Medication List        Accurate as of January 11, 2022   4:49 PM. If you have any questions, ask your nurse or doctor.          atorvastatin 20 MG tablet Commonly known as: LIPITOR TAKE 1 TABLET BY MOUTH EVERY DAY   calcium-vitamin D 500-200 MG-UNIT tablet Commonly known as: OSCAL WITH D Take 1 tablet by mouth daily.   Fish Oil 500 MG Caps Take 500 mg by mouth daily.   GenTeal Mild 0.2 % Soln Generic drug: Hypromellose Place 1 drop into both eyes daily.   levothyroxine 100 MCG tablet Commonly known as: SYNTHROID Take 1 tablet (100 mcg total) by mouth daily.   levothyroxine 112 MCG tablet Commonly known as: SYNTHROID TAKE 1 TABLET BY MOUTH EVERY DAY   meclizine 12.5 MG tablet Commonly known as: ANTIVERT Take 1 tablet (12.5 mg total) by mouth 3 (three) times daily as needed for dizziness.   memantine 21 MG Cp24 24 hr capsule Commonly known as: NAMENDA XR TAKE 1 CAPSULE (21 MG TOTAL) BY MOUTH DAILY.   multivitamin with minerals Tabs tablet Take 1 tablet by mouth daily. Centrum Silver   naproxen sodium 220 MG tablet Commonly known as: ALEVE Take 220 mg by mouth daily as needed (for pain or headache).   omeprazole 20 MG capsule Commonly known as: PRILOSEC TAKE 1 CAPSULE BY MOUTH EVERY DAY   senna 8.6 MG Tabs tablet Commonly known as: SENOKOT Take 1 tablet by mouth daily as  needed for mild constipation.   vitamin B-12 500 MCG tablet Commonly known as: CYANOCOBALAMIN Take 500 mcg by mouth daily.         Objective:   BP 134/74   Pulse 80   Temp 98.4 F (36.9 C)   Ht _0  (1.854 m)   Wt 181 lb (82.1 kg)   SpO2 97%   BMI 23.88 kg/m   Wt Readings from Last 3 Encounters:  01/11/22 181 lb (82.1 kg)  08/23/21 180 lb (81.6 kg)  08/04/21 180 lb (81.6 kg)    Physical Exam Vitals and nursing note reviewed.  Constitutional:      Appearance: Normal appearance.  Neurological:     Mental Status: He is alert. Mental status is at baseline.       Assessment & Plan:   Problem List Items Addressed This Visit        Endocrine   Hypothyroidism   Relevant Orders   TSH   Other Visit Diagnoses     Fatigue, unspecified type    -  Primary   Relevant Orders   CBC with Differential/Platelet   CMP14+EGFR   TSH   Ambulatory referral to Sleep Studies   Constipation, unspecified constipation type       Daytime somnolence       Relevant Orders   Ambulatory referral to Sleep Studies       We will place referral to sleep study for fatigue and somnolence, will also recheck his thyroid levels and blood work.  Recommended for the constipation to use MiraLAX over-the-counter daily and drink plenty of fluids with it Follow up plan: Return if symptoms worsen or fail to improve.  Counseling provided for all of the vaccine components Orders Placed This Encounter  Procedures   CBC with Differential/Platelet   CMP14+EGFR   TSH   Ambulatory referral to Sleep Studies    Caryl Pina, MD [facility] 01/11/2022, 4:49 PM

## 2022-01-12 LAB — CBC WITH DIFFERENTIAL/PLATELET
Basophils Absolute: 0 10*3/uL (ref 0.0–0.2)
Basos: 1 %
EOS (ABSOLUTE): 0.2 10*3/uL (ref 0.0–0.4)
Eos: 3 %
Hematocrit: 39.4 % (ref 37.5–51.0)
Hemoglobin: 13.4 g/dL (ref 13.0–17.7)
Immature Grans (Abs): 0 10*3/uL (ref 0.0–0.1)
Immature Granulocytes: 0 %
Lymphocytes Absolute: 1.1 10*3/uL (ref 0.7–3.1)
Lymphs: 17 %
MCH: 32.4 pg (ref 26.6–33.0)
MCHC: 34 g/dL (ref 31.5–35.7)
MCV: 95 fL (ref 79–97)
Monocytes Absolute: 0.7 10*3/uL (ref 0.1–0.9)
Monocytes: 11 %
Neutrophils Absolute: 4.6 10*3/uL (ref 1.4–7.0)
Neutrophils: 68 %
Platelets: 165 10*3/uL (ref 150–450)
RBC: 4.14 x10E6/uL (ref 4.14–5.80)
RDW: 12.5 % (ref 11.6–15.4)
WBC: 6.6 10*3/uL (ref 3.4–10.8)

## 2022-01-12 LAB — CMP14+EGFR
ALT: 13 IU/L (ref 0–44)
AST: 23 IU/L (ref 0–40)
Albumin/Globulin Ratio: 1.8 (ref 1.2–2.2)
Albumin: 4 g/dL (ref 3.7–4.7)
Alkaline Phosphatase: 107 IU/L (ref 44–121)
BUN/Creatinine Ratio: 15 (ref 10–24)
BUN: 14 mg/dL (ref 8–27)
Bilirubin Total: 1.1 mg/dL (ref 0.0–1.2)
CO2: 26 mmol/L (ref 20–29)
Calcium: 8.9 mg/dL (ref 8.6–10.2)
Chloride: 104 mmol/L (ref 96–106)
Creatinine, Ser: 0.96 mg/dL (ref 0.76–1.27)
Globulin, Total: 2.2 g/dL (ref 1.5–4.5)
Glucose: 103 mg/dL — ABNORMAL HIGH (ref 70–99)
Potassium: 3.7 mmol/L (ref 3.5–5.2)
Sodium: 143 mmol/L (ref 134–144)
Total Protein: 6.2 g/dL (ref 6.0–8.5)
eGFR: 80 mL/min/{1.73_m2} (ref >59)

## 2022-01-12 LAB — TSH: TSH: 0.149 u[IU]/mL — ABNORMAL LOW (ref 0.450–4.500)

## 2022-01-23 ENCOUNTER — Other Ambulatory Visit: Payer: Self-pay | Admitting: Family Medicine

## 2022-01-23 DIAGNOSIS — R413 Other amnesia: Secondary | ICD-10-CM

## 2022-01-23 DIAGNOSIS — F03918 Unspecified dementia, unspecified severity, with other behavioral disturbance: Secondary | ICD-10-CM

## 2022-01-30 ENCOUNTER — Other Ambulatory Visit: Payer: Self-pay | Admitting: Family Medicine

## 2022-01-30 DIAGNOSIS — R42 Dizziness and giddiness: Secondary | ICD-10-CM

## 2022-01-30 NOTE — Telephone Encounter (Signed)
Dettinger coverage.... Last office visit 01/11/22 Last refill 11/18/21, #30, 2 refills

## 2022-02-01 ENCOUNTER — Encounter: Payer: Self-pay | Admitting: Family Medicine

## 2022-02-01 ENCOUNTER — Ambulatory Visit (INDEPENDENT_AMBULATORY_CARE_PROVIDER_SITE_OTHER): Payer: Medicare Other | Admitting: Family Medicine

## 2022-02-01 ENCOUNTER — Ambulatory Visit: Payer: Medicare Other | Admitting: Family Medicine

## 2022-02-01 VITALS — BP 137/78 | HR 65 | Temp 98.2°F | Ht 73.0 in | Wt 178.0 lb

## 2022-02-01 DIAGNOSIS — E782 Mixed hyperlipidemia: Secondary | ICD-10-CM

## 2022-02-01 DIAGNOSIS — F03918 Unspecified dementia, unspecified severity, with other behavioral disturbance: Secondary | ICD-10-CM

## 2022-02-01 DIAGNOSIS — E039 Hypothyroidism, unspecified: Secondary | ICD-10-CM | POA: Diagnosis not present

## 2022-02-01 DIAGNOSIS — F03B0 Unspecified dementia, moderate, without behavioral disturbance, psychotic disturbance, mood disturbance, and anxiety: Secondary | ICD-10-CM | POA: Diagnosis not present

## 2022-02-01 DIAGNOSIS — R413 Other amnesia: Secondary | ICD-10-CM | POA: Diagnosis not present

## 2022-02-01 DIAGNOSIS — K219 Gastro-esophageal reflux disease without esophagitis: Secondary | ICD-10-CM

## 2022-02-01 MED ORDER — ATORVASTATIN CALCIUM 20 MG PO TABS: 20.0000 mg | ORAL_TABLET | Freq: Every day | ORAL | 3 refills | Status: DC

## 2022-02-01 MED ORDER — MEMANTINE HCL ER 21 MG PO CP24: 21.0000 mg | ORAL_CAPSULE | Freq: Every day | ORAL | 3 refills | Status: DC

## 2022-02-01 MED ORDER — OMEPRAZOLE 20 MG PO CPDR: 20.0000 mg | DELAYED_RELEASE_CAPSULE | Freq: Every day | ORAL | 3 refills | Status: DC

## 2022-02-01 NOTE — Progress Notes (Signed)
BP 137/78   Pulse 65   Temp 98.2 F (36.8 C)   Ht '6\' 1"'$  (1.854 m)   Wt 178 lb (80.7 kg)   SpO2 98%   BMI 23.48 kg/m    Subjective:   Patient ID: Christian Johnson, male    DOB: [birth year], 81 y.o.   MRN: [id number]  HPI: Christian Johnson is a 81 y.o. male presenting on 02/01/2022 for Medical Management of Chronic Issues and Hypothyroidism   HPI Hypothyroidism recheck Patient is coming in for thyroid recheck today as well. They deny any issues with hair changes or heat or cold problems or diarrhea or constipation. They deny any chest pain or palpitations. They are currently on levothyroxine 131mcrograms   GERD Patient is currently on omeprazole.  She denies any major symptoms or abdominal pain or belching or burping. She denies any blood in her stool or lightheadedness or dizziness.   Hyperlipidemia Patient is coming in for recheck of his hyperlipidemia. The patient is currently taking atorvastatin. They deny any issues with myalgias or history of liver damage from it. They deny any focal numbness or weakness or chest pain.   Dementia and memory recheck Patient is coming in for dementia and memory recheck.  He is currently taking Namenda.  Doing well as can be expected, he still has some memory issues and not a huge amount improvement from the medicine but has mild improvement.  Relevant past medical, surgical, family and social history reviewed and updated as indicated. Interim medical history since our last visit reviewed. Allergies and medications reviewed and updated.  Review of Systems  Constitutional:  Negative for chills and fever.  Eyes:  Negative for visual disturbance.  Respiratory:  Negative for shortness of breath and wheezing.   Cardiovascular:  Negative for chest pain and leg swelling.  Musculoskeletal:  Negative for back pain and gait problem.  Skin:  Negative for rash.  Neurological:  Negative for dizziness, weakness and light-headedness.  Psychiatric/Behavioral:   Positive for confusion. Negative for decreased concentration, dysphoric mood and sleep disturbance. The patient is not nervous/anxious.   All other systems reviewed and are negative.   Per HPI unless specifically indicated above   Allergies as of 02/01/2022   No Known Allergies      Medication List        Accurate as of February 01, 2022 11:50 AM. If you have any questions, ask your nurse or doctor.          atorvastatin 20 MG tablet Commonly known as: LIPITOR Take 1 tablet (20 mg total) by mouth daily.   calcium-vitamin D 500-200 MG-UNIT tablet Commonly known as: OSCAL WITH D Take 1 tablet by mouth daily.   cyanocobalamin 500 MCG tablet Commonly known as: CYANOCOBALAMIN Take 500 mcg by mouth daily.   Fish Oil 500 MG Caps Take 500 mg by mouth daily.   GenTeal Mild 0.2 % Soln Generic drug: Hypromellose Place 1 drop into both eyes daily.   levothyroxine 100 MCG tablet Commonly known as: SYNTHROID Take 1 tablet (100 mcg total) by mouth daily. What changed: Another medication with the same name was removed. Continue taking this medication, and follow the directions you see here. Changed by: JFransisca KaufmannDettinger, MD   meclizine 12.5 MG tablet Commonly known as: ANTIVERT TAKE 1 TABLET BY MOUTH 3 TIMES DAILY AS NEEDED FOR DIZZINESS.   memantine 21 MG Cp24 24 hr capsule Commonly known as: NAMENDA XR Take 1 capsule (21 mg total) by mouth  daily.   multivitamin with minerals Tabs tablet Take 1 tablet by mouth daily. Centrum Silver   naproxen sodium 220 MG tablet Commonly known as: ALEVE Take 220 mg by mouth daily as needed (for pain or headache).   omeprazole 20 MG capsule Commonly known as: PRILOSEC Take 1 capsule (20 mg total) by mouth daily. What changed: how much to take Changed by: Worthy Rancher, MD   senna 8.6 MG Tabs tablet Commonly known as: SENOKOT Take 1 tablet by mouth daily as needed for mild constipation.         Objective:   BP 137/78    Pulse 65   Temp 98.2 F (36.8 C)   Ht '6\' 1"'$  (1.854 m)   Wt 178 lb (80.7 kg)   SpO2 98%   BMI 23.48 kg/m   Wt Readings from Last 3 Encounters:  02/01/22 178 lb (80.7 kg)  01/11/22 181 lb (82.1 kg)  08/23/21 180 lb (81.6 kg)    Physical Exam Vitals and nursing note reviewed.  Constitutional:      General: He is not in acute distress.    Appearance: He is well-developed. He is not diaphoretic.  Eyes:     General: No scleral icterus.    Conjunctiva/sclera: Conjunctivae normal.  Neck:     Thyroid: No thyromegaly.  Cardiovascular:     Rate and Rhythm: Normal rate and regular rhythm.     Heart sounds: Normal heart sounds. No murmur heard. Pulmonary:     Effort: Pulmonary effort is normal. No respiratory distress.     Breath sounds: Normal breath sounds. No wheezing.  Musculoskeletal:        General: Normal range of motion.     Cervical back: Neck supple.  Lymphadenopathy:     Cervical: No cervical adenopathy.  Skin:    General: Skin is warm and dry.     Findings: No rash.  Neurological:     Mental Status: He is alert and oriented to person, place, and time.     Coordination: Coordination normal.  Psychiatric:        Behavior: Behavior normal.       Assessment & Plan:   Problem List Items Addressed This Visit       Digestive   GERD (gastroesophageal reflux disease)   Relevant Medications   omeprazole (PRILOSEC) 20 MG capsule     Endocrine   Hypothyroidism - Primary   Relevant Orders   TSH     Nervous and Auditory   Dementia (HCC)   Relevant Medications   memantine (NAMENDA XR) 21 MG CP24 24 hr capsule     Other   Hyperlipidemia   Relevant Medications   atorvastatin (LIPITOR) 20 MG tablet   Other Relevant Orders   Lipid panel   Other Visit Diagnoses     Dementia with behavioral disturbance (HCC)       Relevant Medications   memantine (NAMENDA XR) 21 MG CP24 24 hr capsule   Memory difficulties       Relevant Medications   memantine (NAMENDA XR)  21 MG CP24 24 hr capsule     Continue current medicine recheck cholesterol and thyroid today.  Follow up plan: Return in about 6 months (around 08/04/2022), or if symptoms worsen or fail to improve, for Thyroid and cholesterol recheck.  Counseling provided for all of the vaccine components Orders Placed This Encounter  Procedures   Lipid panel   TSH    Caryl Pina, MD [place]  02/01/2022, 11:50 AM

## 2022-02-02 ENCOUNTER — Other Ambulatory Visit: Payer: Self-pay

## 2022-02-02 LAB — TSH: TSH: 0.154 u[IU]/mL — ABNORMAL LOW (ref 0.450–4.500)

## 2022-02-02 LAB — LIPID PANEL
Chol/HDL Ratio: 2.3 ratio (ref 0.0–5.0)
Cholesterol, Total: 118 mg/dL (ref 100–199)
HDL: 51 mg/dL (ref >39)
LDL Chol Calc (NIH): 54 mg/dL (ref 0–99)
Triglycerides: 61 mg/dL (ref 0–149)
VLDL Cholesterol Cal: 13 mg/dL (ref 5–40)

## 2022-02-02 MED ORDER — LEVOTHYROXINE SODIUM 88 MCG PO TABS: 88.0000 ug | ORAL_TABLET | Freq: Every day | ORAL | 1 refills | Status: DC

## 2022-05-01 ENCOUNTER — Ambulatory Visit (INDEPENDENT_AMBULATORY_CARE_PROVIDER_SITE_OTHER): Payer: Medicare Other

## 2022-05-01 DIAGNOSIS — Z23 Encounter for immunization: Secondary | ICD-10-CM

## 2022-05-10 DIAGNOSIS — L570 Actinic keratosis: Secondary | ICD-10-CM | POA: Diagnosis not present

## 2022-05-10 DIAGNOSIS — C44319 Basal cell carcinoma of skin of other parts of face: Secondary | ICD-10-CM | POA: Diagnosis not present

## 2022-05-10 DIAGNOSIS — X32XXXD Exposure to sunlight, subsequent encounter: Secondary | ICD-10-CM | POA: Diagnosis not present

## 2022-06-28 DIAGNOSIS — Z85828 Personal history of other malignant neoplasm of skin: Secondary | ICD-10-CM | POA: Diagnosis not present

## 2022-06-28 DIAGNOSIS — L570 Actinic keratosis: Secondary | ICD-10-CM | POA: Diagnosis not present

## 2022-06-28 DIAGNOSIS — C44319 Basal cell carcinoma of skin of other parts of face: Secondary | ICD-10-CM | POA: Diagnosis not present

## 2022-06-28 DIAGNOSIS — X32XXXD Exposure to sunlight, subsequent encounter: Secondary | ICD-10-CM | POA: Diagnosis not present

## 2022-06-28 DIAGNOSIS — Z08 Encounter for follow-up examination after completed treatment for malignant neoplasm: Secondary | ICD-10-CM | POA: Diagnosis not present

## 2022-07-24 ENCOUNTER — Other Ambulatory Visit: Payer: Self-pay | Admitting: Family Medicine

## 2022-08-03 ENCOUNTER — Ambulatory Visit (INDEPENDENT_AMBULATORY_CARE_PROVIDER_SITE_OTHER): Payer: Medicare Other | Admitting: Family Medicine

## 2022-08-03 ENCOUNTER — Encounter: Payer: Self-pay | Admitting: Family Medicine

## 2022-08-03 VITALS — BP 181/76 | HR 68 | Temp 97.6°F | Ht 73.0 in | Wt 183.0 lb

## 2022-08-03 DIAGNOSIS — E039 Hypothyroidism, unspecified: Secondary | ICD-10-CM | POA: Diagnosis not present

## 2022-08-03 DIAGNOSIS — E782 Mixed hyperlipidemia: Secondary | ICD-10-CM | POA: Diagnosis not present

## 2022-08-03 DIAGNOSIS — Z23 Encounter for immunization: Secondary | ICD-10-CM

## 2022-08-03 DIAGNOSIS — F03B0 Unspecified dementia, moderate, without behavioral disturbance, psychotic disturbance, mood disturbance, and anxiety: Secondary | ICD-10-CM

## 2022-08-03 MED ORDER — LEVOTHYROXINE SODIUM 88 MCG PO TABS: 88.0000 ug | ORAL_TABLET | Freq: Every day | ORAL | 1 refills | Status: DC

## 2022-08-03 NOTE — Progress Notes (Signed)
BP (!) 181/76   Pulse 68   Temp 97.6 F (36.4 C)   Ht '6\' 1"'$  (1.854 m)   Wt 183 lb (83 kg)   SpO2 98%   BMI 24.14 kg/m    Subjective:   Patient ID: Christian Johnson, male    DOB: 1940/08/15, 82 y.o.   MRN: [id number]  HPI: Christian Johnson is a 82 y.o. male presenting on 08/03/2022 for Medical Management of Chronic Issues and Hypothyroidism   HPI Hypothyroidism recheck Patient is coming in for thyroid recheck today as well. They deny any issues with hair changes or heat or cold problems or diarrhea or constipation. They deny any chest pain or palpitations. They are currently on levothyroxine 88 micrograms   Hyperlipidemia Patient is coming in for recheck of his hyperlipidemia. The patient is currently taking fish oils and atorvastatin. They deny any issues with myalgias or history of liver damage from it. They deny any focal numbness or weakness or chest pain.   Dementia and memory recheck Patient is coming in today for dementia and memory recheck.  He is currently taking Namenda.  His wife is here with him and he feels that his memory has been worsening and does not really feel like the Namenda helps.  We did discuss the possibility of the new infusion but with it being slightly low and unknown side effects.  Cautious about it but will definitely readdress in the future.  Relevant past medical, surgical, family and social history reviewed and updated as indicated. Interim medical history since our last visit reviewed. Allergies and medications reviewed and updated.  Review of Systems  Constitutional:  Negative for chills and fever.  Eyes:  Negative for visual disturbance.  Respiratory:  Negative for shortness of breath and wheezing.   Cardiovascular:  Negative for chest pain and leg swelling.  Musculoskeletal:  Negative for back pain and gait problem.  Skin:  Negative for rash.  Neurological:  Negative for dizziness, weakness and numbness.  All other systems reviewed and are  negative.   Per HPI unless specifically indicated above   Allergies as of 08/03/2022   No Known Allergies      Medication List        Accurate as of August 03, 2022 10:00 AM. If you have any questions, ask your nurse or doctor.          atorvastatin 20 MG tablet Commonly known as: LIPITOR Take 1 tablet (20 mg total) by mouth daily.   calcium-vitamin D 500-200 MG-UNIT tablet Commonly known as: OSCAL WITH D Take 1 tablet by mouth daily.   cyanocobalamin 500 MCG tablet Commonly known as: VITAMIN B12 Take 500 mcg by mouth daily.   Fish Oil 500 MG Caps Take 500 mg by mouth daily.   GenTeal Mild 0.2 % Soln Generic drug: Hypromellose Place 1 drop into both eyes daily.   levothyroxine 88 MCG tablet Commonly known as: SYNTHROID Take 1 tablet (88 mcg total) by mouth daily.   meclizine 12.5 MG tablet Commonly known as: ANTIVERT TAKE 1 TABLET BY MOUTH 3 TIMES DAILY AS NEEDED FOR DIZZINESS.   memantine 21 MG Cp24 24 hr capsule Commonly known as: NAMENDA XR Take 1 capsule (21 mg total) by mouth daily.   multivitamin with minerals Tabs tablet Take 1 tablet by mouth daily. Centrum Silver   naproxen sodium 220 MG tablet Commonly known as: ALEVE Take 220 mg by mouth daily as needed (for pain or headache).   omeprazole 20  MG capsule Commonly known as: PRILOSEC Take 1 capsule (20 mg total) by mouth daily.   senna 8.6 MG Tabs tablet Commonly known as: SENOKOT Take 1 tablet by mouth daily as needed for mild constipation.         Objective:   BP (!) 181/76   Pulse 68   Temp 97.6 F (36.4 C)   Ht '6\' 1"'$  (1.854 m)   Wt 183 lb (83 kg)   SpO2 98%   BMI 24.14 kg/m   Wt Readings from Last 3 Encounters:  08/03/22 183 lb (83 kg)  02/01/22 178 lb (80.7 kg)  01/11/22 181 lb (82.1 kg)    Physical Exam Vitals and nursing note reviewed.  Constitutional:      General: He is not in acute distress.    Appearance: He is well-developed. He is not diaphoretic.  Eyes:      General: No scleral icterus.    Conjunctiva/sclera: Conjunctivae normal.  Neck:     Thyroid: No thyromegaly.  Cardiovascular:     Rate and Rhythm: Normal rate and regular rhythm.     Heart sounds: Normal heart sounds. No murmur heard. Pulmonary:     Effort: Pulmonary effort is normal. No respiratory distress.     Breath sounds: Normal breath sounds. No wheezing.  Musculoskeletal:        General: No swelling. Normal range of motion.     Cervical back: Neck supple.  Lymphadenopathy:     Cervical: No cervical adenopathy.  Skin:    General: Skin is warm and dry.     Findings: No rash.  Neurological:     Mental Status: He is alert and oriented to person, place, and time.     Coordination: Coordination normal.  Psychiatric:        Behavior: Behavior normal.       Assessment & Plan:   Problem List Items Addressed This Visit       Endocrine   Hypothyroidism - Primary   Relevant Medications   levothyroxine (SYNTHROID) 88 MCG tablet   Other Relevant Orders   TSH     Nervous and Auditory   Dementia ([place])   Relevant Orders   CBC with Differential/Platelet     Other   Hyperlipidemia   Relevant Orders   CBC with Differential/Platelet   CMP14+EGFR   Lipid panel   Other Visit Diagnoses     Need for shingles vaccine       Relevant Orders   Zoster Recombinant (Shingrix )       Will check blood work today, also got vaccine.  Recommended to keep a close eye on his blood pressures at home, he does not usually have blood pressure issues but they are aware to check it at home and call us with some numbers over the next few weeks Follow up plan: Return in about 6 months (around 02/01/2023), or if symptoms worsen or fail to improve, for Hyperlipidemia and hypothyroidism.  Counseling provided for all of the vaccine components Orders Placed This Encounter  Procedures   Zoster Recombinant (Shingrix )   CBC with Differential/Platelet   CMP14+EGFR   Lipid panel   TSH     Caryl Pina, MD [facility] 08/03/2022, 10:00 AM

## 2022-08-04 LAB — CMP14+EGFR
ALT: 20 IU/L (ref 0–44)
AST: 27 IU/L (ref 0–40)
Albumin/Globulin Ratio: 1.7 (ref 1.2–2.2)
Albumin: 3.9 g/dL (ref 3.8–4.8)
Alkaline Phosphatase: 131 IU/L — ABNORMAL HIGH (ref 44–121)
BUN/Creatinine Ratio: 11 (ref 10–24)
BUN: 11 mg/dL (ref 8–27)
Bilirubin Total: 1 mg/dL (ref 0.0–1.2)
CO2: 26 mmol/L (ref 20–29)
Calcium: 8.8 mg/dL (ref 8.6–10.2)
Chloride: 104 mmol/L (ref 96–106)
Creatinine, Ser: 1.02 mg/dL (ref 0.76–1.27)
Globulin, Total: 2.3 g/dL (ref 1.5–4.5)
Glucose: 95 mg/dL (ref 70–99)
Potassium: 4 mmol/L (ref 3.5–5.2)
Sodium: 144 mmol/L (ref 134–144)
Total Protein: 6.2 g/dL (ref 6.0–8.5)
eGFR: 74 mL/min/{1.73_m2} (ref >59)

## 2022-08-04 LAB — CBC WITH DIFFERENTIAL/PLATELET
Basophils Absolute: 0 10*3/uL (ref 0.0–0.2)
Basos: 1 %
EOS (ABSOLUTE): 0.2 10*3/uL (ref 0.0–0.4)
Eos: 3 %
Hematocrit: 40.2 % (ref 37.5–51.0)
Hemoglobin: 13.9 g/dL (ref 13.0–17.7)
Immature Grans (Abs): 0 10*3/uL (ref 0.0–0.1)
Immature Granulocytes: 0 %
Lymphocytes Absolute: 0.8 10*3/uL (ref 0.7–3.1)
Lymphs: 15 %
MCH: 33 pg (ref 26.6–33.0)
MCHC: 34.6 g/dL (ref 31.5–35.7)
MCV: 96 fL (ref 79–97)
Monocytes Absolute: 0.5 10*3/uL (ref 0.1–0.9)
Monocytes: 10 %
Neutrophils Absolute: 3.9 10*3/uL (ref 1.4–7.0)
Neutrophils: 71 %
Platelets: 171 10*3/uL (ref 150–450)
RBC: 4.21 x10E6/uL (ref 4.14–5.80)
RDW: 12.5 % (ref 11.6–15.4)
WBC: 5.4 10*3/uL (ref 3.4–10.8)

## 2022-08-04 LAB — LIPID PANEL
Chol/HDL Ratio: 2.2 ratio (ref 0.0–5.0)
Cholesterol, Total: 138 mg/dL (ref 100–199)
HDL: 64 mg/dL (ref >39)
LDL Chol Calc (NIH): 61 mg/dL (ref 0–99)
Triglycerides: 60 mg/dL (ref 0–149)
VLDL Cholesterol Cal: 13 mg/dL (ref 5–40)

## 2022-08-04 LAB — TSH: TSH: 5.41 u[IU]/mL — ABNORMAL HIGH (ref 0.450–4.500)

## 2022-08-24 ENCOUNTER — Ambulatory Visit (INDEPENDENT_AMBULATORY_CARE_PROVIDER_SITE_OTHER): Payer: Medicare Other

## 2022-08-24 VITALS — Ht 72.0 in | Wt 185.0 lb

## 2022-08-24 DIAGNOSIS — Z0000 Encounter for general adult medical examination without abnormal findings: Secondary | ICD-10-CM | POA: Diagnosis not present

## 2022-08-24 NOTE — Patient Instructions (Signed)
Christian Johnson , Thank you for taking time to come for your Medicare Wellness Visit. I appreciate your ongoing commitment to your health goals. Please review the following plan we discussed and let me know if I can assist you in the future.   These are the goals we discussed:  Goals      AWV     08/20/2019 AWV Goal: Fall Prevention  Over the next year, patient will decrease their risk for falls by: Using assistive devices, such as a cane or walker, as needed Identifying fall risks within their home and correcting them by: Removing throw rugs Adding handrails to stairs or ramps Removing clutter and keeping a clear pathway throughout the home Increasing light, especially at night Adding shower handles/bars Raising toilet seat Identifying potential personal risk factors for falls: Medication side effects Incontinence/urgency Vestibular dysfunction Hearing loss Musculoskeletal disorders Neurological disorders Orthostatic hypotension  08/20/2019 AWV Goal: Exercise for General Health  Patient will verbalize understanding of the benefits of increased physical activity: Exercising regularly is important. It will improve your overall fitness, flexibility, and endurance. Regular exercise also will improve your overall health. It can help you control your weight, reduce stress, and improve your bone density. Over the next year, patient will increase physical activity as tolerated with a goal of at least 150 minutes of moderate physical activity per week.  You can tell that you are exercising at a moderate intensity if your heart starts beating faster and you start breathing faster but can still hold a conversation. Moderate-intensity exercise ideas include: Walking 1 mile (1.6 km) in about 15 minutes Biking Hiking Golfing Dancing Water aerobics Patient will verbalize understanding of everyday activities that increase physical activity by providing examples like the following: Yard work, such  as: Sales promotion account executive Gardening Washing windows or floors Patient will be able to explain general safety guidelines for exercising:  Before you start a new exercise program, talk with your health care provider. Do not exercise so much that you hurt yourself, feel dizzy, or get very short of breath. Wear comfortable clothes and wear shoes with good support. Drink plenty of water while you exercise to prevent dehydration or heat stroke. Work out until your breathing and your heartbeat get faster.        DIET - INCREASE WATER INTAKE     Try to drink 6-8 glasses of water daily.     Exercise 150 min/wk Moderate Activity        This is a list of the screening recommended for you and due dates:  Health Maintenance  Topic Date Due   COVID-19 Vaccine (4 - 2023-24 season) 03/16/2022   Zoster (Shingles) Vaccine (2 of 2) 09/28/2022   Colon Cancer Screening  11/07/2022   Medicare Annual Wellness Visit  08/25/2023   DTaP/Tdap/Td vaccine (3 - Td or Tdap) 08/05/2031   Pneumonia Vaccine  Completed   Flu Shot  Completed   HPV Vaccine  Aged Out    Advanced directives: .[place]  Conditions/risks identified: Aim for 30 minutes of exercise or brisk walking, 6-8 glasses of water, and 5 servings of fruits and vegetables each day.   Next appointment: Follow up in one year for your annual wellness visit.   Preventive Care 32 Years and Older, Male  Preventive care refers to lifestyle choices and visits with your health care provider that can promote health and wellness. What does preventive care include? A  yearly physical exam. This is also called an annual well check. Dental exams once or twice a year. Routine eye exams. Ask your health care provider how often you should have your eyes checked. Personal lifestyle choices, including: Daily care of your teeth and gums. Regular physical activity. Eating a healthy  diet. Avoiding tobacco and drug use. Limiting alcohol use. Practicing safe sex. Taking low doses of aspirin every day. Taking vitamin and mineral supplements as recommended by your health care provider. What happens during an annual well check? The services and screenings done by your health care provider during your annual well check will depend on your age, overall health, lifestyle risk factors, and family history of disease. Counseling  Your health care provider may ask you questions about your: Alcohol use. Tobacco use. Drug use. Emotional well-being. Home and relationship well-being. Sexual activity. Eating habits. History of falls. Memory and ability to understand (cognition). Work and work Statistician. Screening  You may have the following tests or measurements: Height, weight, and BMI. Blood pressure. Lipid and cholesterol levels. These may be checked every 5 years, or more frequently if you are over 40 years old. Skin check. Lung cancer screening. You may have this screening every year starting at age 80 if you have a 30-pack-year history of smoking and currently smoke or have quit within the past 15 years. Fecal occult blood test (FOBT) of the stool. You may have this test every year starting at age 61. Flexible sigmoidoscopy or colonoscopy. You may have a sigmoidoscopy every 5 years or a colonoscopy every 10 years starting at age 58. Prostate cancer screening. Recommendations will vary depending on your family history and other risks. Hepatitis C blood test. Hepatitis B blood test. Sexually transmitted disease (STD) testing. Diabetes screening. This is done by checking your blood sugar (glucose) after you have not eaten for a while (fasting). You may have this done every 1-3 years. Abdominal aortic aneurysm (AAA) screening. You may need this if you are a current or former smoker. Osteoporosis. You may be screened starting at age 52 if you are at high risk. Talk with  your health care provider about your test results, treatment options, and if necessary, the need for more tests. Vaccines  Your health care provider may recommend certain vaccines, such as: Influenza vaccine. This is recommended every year. Tetanus, diphtheria, and acellular pertussis (Tdap, Td) vaccine. You may need a Td booster every 10 years. Zoster vaccine. You may need this after age 48. Pneumococcal 13-valent conjugate (PCV13) vaccine. One dose is recommended after age 27. Pneumococcal polysaccharide (PPSV23) vaccine. One dose is recommended after age 42. Talk to your health care provider about which screenings and vaccines you need and how often you need them. This information is not intended to replace advice given to you by your health care provider. Make sure you discuss any questions you have with your health care provider. Document Released: 07/29/2015 Document Revised: 03/21/2016 Document Reviewed: 05/03/2015 Elsevier Interactive Patient Education  2017 [employer] Prevention in the Home Falls can cause injuries. They can happen to people of all ages. There are many things you can do to make your home safe and to help prevent falls. What can I do on the outside of my home? Regularly fix the edges of walkways and driveways and fix any cracks. Remove anything that might make you trip as you walk through a door, such as a raised step or threshold. Trim any bushes or trees on the path  to your home. Use bright outdoor lighting. Clear any walking paths of anything that might make someone trip, such as rocks or tools. Regularly check to see if handrails are loose or broken. Make sure that both sides of any steps have handrails. Any raised decks and porches should have guardrails on the edges. Have any leaves, snow, or ice cleared regularly. Use sand or salt on walking paths during winter. Clean up any spills in your garage right away. This includes oil or grease spills. What  can I do in the bathroom? Use night lights. Install grab bars by the toilet and in the tub and shower. Do not use towel bars as grab bars. Use non-skid mats or decals in the tub or shower. If you need to sit down in the shower, use a plastic, non-slip stool. Keep the floor dry. Clean up any water that spills on the floor as soon as it happens. Remove soap buildup in the tub or shower regularly. Attach bath mats securely with double-sided non-slip rug tape. Do not have throw rugs and other things on the floor that can make you trip. What can I do in the bedroom? Use night lights. Make sure that you have a light by your bed that is easy to reach. Do not use any sheets or blankets that are too big for your bed. They should not hang down onto the floor. Have a firm chair that has side arms. You can use this for support while you get dressed. Do not have throw rugs and other things on the floor that can make you trip. What can I do in the kitchen? Clean up any spills right away. Avoid walking on wet floors. Keep items that you use a lot in easy-to-reach places. If you need to reach something above you, use a strong step stool that has a grab bar. Keep electrical cords out of the way. Do not use floor polish or wax that makes floors slippery. If you must use wax, use non-skid floor wax. Do not have throw rugs and other things on the floor that can make you trip. What can I do with my stairs? Do not leave any items on the stairs. Make sure that there are handrails on both sides of the stairs and use them. Fix handrails that are broken or loose. Make sure that handrails are as long as the stairways. Check any carpeting to make sure that it is firmly attached to the stairs. Fix any carpet that is loose or worn. Avoid having throw rugs at the top or bottom of the stairs. If you do have throw rugs, attach them to the floor with carpet tape. Make sure that you have a light switch at the top of the  stairs and the bottom of the stairs. If you do not have them, ask someone to add them for you. What else can I do to help prevent falls? Wear shoes that: Do not have high heels. Have rubber bottoms. Are comfortable and fit you well. Are closed at the toe. Do not wear sandals. If you use a stepladder: Make sure that it is fully opened. Do not climb a closed stepladder. Make sure that both sides of the stepladder are locked into place. Ask someone to hold it for you, if possible. Clearly mark and make sure that you can see: Any grab bars or handrails. First and last steps. Where the edge of each step is. Use tools that help you move around (mobility aids)  if they are needed. These include: Canes. Walkers. Scooters. Crutches. Turn on the lights when you go into a dark area. Replace any light bulbs as soon as they burn out. Set up your furniture so you have a clear path. Avoid moving your furniture around. If any of your floors are uneven, fix them. If there are any pets around you, be aware of where they are. Review your medicines with your doctor. Some medicines can make you feel dizzy. This can increase your chance of falling. Ask your doctor what other things that you can do to help prevent falls. This information is not intended to replace advice given to you by your health care provider. Make sure you discuss any questions you have with your health care provider. Document Released: 04/28/2009 Document Revised: 12/08/2015 Document Reviewed: 08/06/2014 Elsevier Interactive Patient Education  2017 [employer].

## 2022-08-24 NOTE — Progress Notes (Signed)
Subjective:   Christian Johnson is a 82 y.o. male who presents for Medicare Annual/Subsequent preventive examination. I connected with  [name] on 08/24/22 by a audio enabled telemedicine application and verified that I am speaking with the correct person using two identifiers.  Patient Location: Home  Provider Location: Home Office  I discussed the limitations of evaluation and management by telemedicine. The patient expressed understanding and agreed to proceed.  Review of Systems     Cardiac Risk Factors include: advanced age (>72mn, >>80women);male gender;hypertension;dyslipidemia     Objective:    Today's Vitals   08/24/22 0913  Weight: 185 lb (83.9 kg)  Height: 6' (1.829 m)   Body mass index is 25.09 kg/m.     08/24/2022    9:17 AM 08/23/2021    9:31 AM 08/22/2020    8:14 AM 08/20/2019    8:41 AM 03/09/2019   10:04 AM 02/24/2019   11:02 AM 11/06/2017    7:27 AM  Advanced Directives  Does Patient Have a Medical Advance Directive? No No No No No No Yes  Type of ATransport plannerLiving will  Copy of [place] Chart?       No - copy requested  Would patient like information on creating a medical advance directive? No - Patient declined No - Patient declined No - Patient declined No - Patient declined No - Patient declined No - Patient declined     Current Medications (verified) Outpatient Encounter Medications as of 08/24/2022  Medication Sig   atorvastatin (LIPITOR) 20 MG tablet Take 1 tablet (20 mg total) by mouth daily.   calcium-vitamin D (OSCAL WITH D) 500-200 MG-UNIT tablet Take 1 tablet by mouth daily.   GENTEAL MILD 0.2 % SOLN Place 1 drop into both eyes daily.   levothyroxine (SYNTHROID) 88 MCG tablet Take 1 tablet (88 mcg total) by mouth daily.   meclizine (ANTIVERT) 12.5 MG tablet TAKE 1 TABLET BY MOUTH 3 TIMES DAILY AS NEEDED FOR DIZZINESS.   memantine (NAMENDA XR) 21 MG CP24 24 hr capsule Take 1 capsule  (21 mg total) by mouth daily.   Multiple Vitamin (MULTIVITAMIN WITH MINERALS) TABS Take 1 tablet by mouth daily. Centrum Silver   naproxen sodium (ALEVE) 220 MG tablet Take 220 mg by mouth daily as needed (for pain or headache).    Omega-3 Fatty Acids (FISH OIL) 500 MG CAPS Take 500 mg by mouth daily.   omeprazole (PRILOSEC) 20 MG capsule Take 1 capsule (20 mg total) by mouth daily.   senna (SENOKOT) 8.6 MG TABS tablet Take 1 tablet by mouth daily as needed for mild constipation.   vitamin B-12 (CYANOCOBALAMIN) 500 MCG tablet Take 500 mcg by mouth daily.   No facility-administered encounter medications on file as of 08/24/2022.    Allergies (verified) Patient has no known allergies.   History: Past Medical History:  Diagnosis Date   Cancer ([facility]    Prostate   Cataract    Colon polyps    Dementia ([place]    GERD (gastroesophageal reflux disease)    Hyperlipidemia    Hypothyroidism    Memory change    Prostate cancer ([facility]    Past Surgical History:  Procedure Laterality Date   CATARACT EXTRACTION W/PHACO Left 03/09/2019   Procedure: CATARACT EXTRACTION PHACO AND INTRAOCULAR LENS PLACEMENT ([place];  Surgeon: WBaruch Goldmann MD;  Location: [facility] ORS;  Service: Ophthalmology;  Laterality: Left;  left,  CDE: 7.70   COLONOSCOPY  01/31/2012   Procedure: COLONOSCOPY;  Surgeon: Rogene Houston, MD;  Location: [facility] ENDO SUITE;  Service: Endoscopy;  Laterality: N/A;  930   COLONOSCOPY N/A 11/06/2017   Procedure: COLONOSCOPY;  Surgeon: Rogene Houston, MD;  Location: [facility] END[facility] SU[facility];  Service: Endoscopy;  Laterality: N/A;  830   Colonoscopy with polyp removal     EYE SURGERY Left 07/08/2018   cataracts   POLYPECTOMY  11/06/2017   Procedure: POLYPECTOMY;  Surgeon: Rogene Houston, MD;  Location: [facility] END[facility] SU[facility];  Service: Endoscopy;;  cecal (CB), transverse colon (CB), rectal polyp ([facility])   Right hip core decompression     Family History  Problem Relation Age of Onset   Breast cancer Mother    Cancer  Father        Lung   Hypertension Son    Kidney disease Son    Kidney cancer Son    Colon cancer Neg Hx    Social History   Socioeconomic History   Marital status: Married    Spouse name: Vaughan Basta   Number of children: 2   Years of education: 12   Highest education level: High school graduate  Occupational History   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  Tobacco Use   Smoking status: Former    Packs/day: 1.00    Years: 25.00    Total pack years: 25.00    Types: Cigarettes    Quit date: 02/24/1999    Years since quitting: 23.5   Smokeless tobacco: Never  Vaping Use   Vaping Use: Never used  Substance and Sexual Activity   Alcohol use: No   Drug use: No   Sexual activity: Not Currently  Other Topics Concern   Not on file  Social History Narrative   Lives with wife - has basement   Son and daughter live on same street as them   Social Determinants of Health   Financial Resource Strain: Low Risk  (08/24/2022)   Overall Financial Resource Strain (CARDIA)    Difficulty of Paying Living Expenses: Not hard at all  Food Insecurity: No Food Insecurity (08/24/2022)   Hunger Vital Sign    Worried About Running Out of Food in the Last Year: Never true    [place] in the Last Year: Never true  Transportation Needs: No Transportation Needs (08/24/2022)   PRAPARE - Hydrologist (Medical): No    Lack of Transportation (Non-Medical): No  Physical Activity: Inactive (08/24/2022)   Exercise Vital Sign    Days of Exercise per Week: 0 days    Minutes of Exercise per Session: 0 min  Stress: No Stress Concern Present (08/24/2022)   [place]    Feeling of Stress : Not at all  Social Connections: Moderately Integrated (08/24/2022)   Social Connection and Isolation Panel [NHANES]    Frequency of Communication with Friends and Family: More than three times a week    Frequency of Social  Gatherings with Friends and Family: More than three times a week    Attends Religious Services: More than 4 times per year    Active Member of [employer] or Organizations: No    Attends Archivist Meetings: Never    Marital Status: Married    Tobacco Counseling Counseling given: Not Answered   Clinical Intake:  Pre-visit preparation completed: Yes  Pain : No/denies pain  Nutritional Risks: None Diabetes: No  How often do you need to have someone help you when you read instructions, pamphlets, or other written materials from your doctor or pharmacy?: 1 - Never  Diabetic?no   Interpreter Needed?: No  Information entered by :: Jadene Pierini, LPN   Activities of Daily Living    08/24/2022    9:18 AM  In your present state of health, do you have any difficulty performing the following activities:  Hearing? 0  Vision? 0  Difficulty concentrating or making decisions? 0  Walking or climbing stairs? 0  Dressing or bathing? 0  Doing errands, shopping? 0  Preparing Food and eating ? N  Using the Toilet? N  In the past six months, have you accidently leaked urine? N  Do you have problems with loss of bowel control? N  Managing your Medications? N  Managing your Finances? N  Housekeeping or managing your Housekeeping? N    Patient Care Team: Dettinger, Fransisca Kaufmann, MD as PCP - General (Family Medicine)  Indicate any recent Medical Services you may have received from other than [facility] providers in the past year (date may be approximate).     Assessment:   This is a routine wellness examination for [place].  Hearing/Vision screen Vision Screening - Comments:: Wears rx glasses - up to date with routine eye exams with  Dr.Johnson   Dietary issues and exercise activities discussed: Current Exercise Habits: The patient does not participate in regular exercise at present   Goals Addressed             This Visit's Progress    AWV   On track    08/20/2019 AWV Goal:  Fall Prevention  Over the next year, patient will decrease their risk for falls by: Using assistive devices, such as a cane or walker, as needed Identifying fall risks within their home and correcting them by: Removing throw rugs Adding handrails to stairs or ramps Removing clutter and keeping a clear pathway throughout the home Increasing light, especially at night Adding shower handles/bars Raising toilet seat Identifying potential personal risk factors for falls: Medication side effects Incontinence/urgency Vestibular dysfunction Hearing loss Musculoskeletal disorders Neurological disorders Orthostatic hypotension  08/20/2019 AWV Goal: Exercise for General Health  Patient will verbalize understanding of the benefits of increased physical activity: Exercising regularly is important. It will improve your overall fitness, flexibility, and endurance. Regular exercise also will improve your overall health. It can help you control your weight, reduce stress, and improve your bone density. Over the next year, patient will increase physical activity as tolerated with a goal of at least 150 minutes of moderate physical activity per week.  You can tell that you are exercising at a moderate intensity if your heart starts beating faster and you start breathing faster but can still hold a conversation. Moderate-intensity exercise ideas include: Walking 1 mile (1.6 km) in about 15 minutes Biking Hiking Golfing Dancing Water aerobics Patient will verbalize understanding of everyday activities that increase physical activity by providing examples like the following: Yard work, such as: Sales promotion account executive Gardening Washing windows or floors Patient will be able to explain general safety guidelines for exercising:  Before you start a new exercise program, talk with your health care provider. Do not exercise so  much that you hurt yourself, feel dizzy, or get very short of breath. Wear comfortable clothes and wear shoes with good support. Drink  plenty of water while you exercise to prevent dehydration or heat stroke. Work out until your breathing and your heartbeat get faster.          Depression Screen    08/24/2022    9:17 AM 08/03/2022    9:41 AM 02/01/2022   11:14 AM 08/23/2021    8:24 AM 08/04/2021   10:54 AM 05/04/2021   12:50 PM 03/02/2021    2:11 PM  PHQ 2/9 Scores  PHQ - 2 Score 0 1 1 2 2 2 2  $ PHQ- 9 Score 0   4 5 6 7    $ Fall Risk    08/24/2022    9:15 AM 08/03/2022    9:41 AM 02/01/2022   11:14 AM 08/23/2021    9:30 AM 08/04/2021   10:54 AM  Fall Risk   Falls in the past year? 0 0 0 0 0  Number falls in past yr: 0   0   Injury with Fall? 0   0   Risk for fall due to : No Fall Risks   Mental status change   Follow up Falls prevention discussed   Falls prevention discussed     FALL RISK PREVENTION PERTAINING TO THE HOME:  Any stairs in or around the home? Yes  If so, are there any without handrails? No  Home free of loose throw rugs in walkways, pet beds, electrical cords, etc? Yes  Adequate lighting in your home to reduce risk of falls? Yes   ASSISTIVE DEVICES UTILIZED TO PREVENT FALLS:  Life alert? No  Use of a cane, walker or w/c? No  Grab bars in the bathroom? No  Shower chair or bench in shower? No  Elevated toilet seat or a handicapped toilet? No       08/23/2021    9:32 AM 10/31/2020    2:15 PM 12/25/2019    9:32 AM 04/10/2019   12:59 PM  MMSE - Mini Mental State Exam  Not completed: Unable to complete     Orientation to time  3 5 5  $ Orientation to Place  2 2 3  $ Registration  3 3 3  $ Attention/ Calculation  4 0 3  Recall  3 1 0  Language- name 2 objects  2 2 2  $ Language- repeat  1 1 1  $ Language- follow 3 step command  2 3 3  $ Language- read & follow direction  1 1 1  $ Write a sentence  1 1 1  $ Copy design  1 1 1  $ Total score  23 20 23        $ 08/24/2022     9:18 AM 08/22/2020    8:07 AM 08/20/2019    8:47 AM 02/24/2019   11:05 AM  6CIT Screen  What Year? 4 points 0 points 0 points 0 points  What month? 3 points 0 points 0 points 0 points  What time? 3 points 0 points 0 points 0 points  Count back from 20 2 points 0 points 0 points 0 points  Months in reverse 2 points 4 points 4 points 4 points  Repeat phrase 2 points 10 points 6 points 4 points  Total Score 16 points 14 points 10 points 8 points    Immunizations Immunization History  Administered Date(s) Administered   Fluad Quad(high Dose 65+) 04/10/2019, 04/20/2020, 05/04/2021, 05/01/2022   Influenza, High Dose Seasonal PF 03/17/2015, 04/18/2016, 04/23/2017, 04/15/2018   Influenza-Unspecified 04/18/2016, 04/23/2017, 04/15/2018   Moderna SARS-COV2 Booster Vaccination 11/11/2019   Moderna  Sars-Covid-2 Vaccination 10/14/2019, 11/11/2019   Pneumococcal Conjugate-13 07/27/2015   Pneumococcal Polysaccharide-23 12/19/2016   Tdap 01/01/2011, 08/04/2021   Zoster Recombinat (Shingrix) 08/03/2022    TDAP status: Up to date  Flu Vaccine status: Up to date  Pneumococcal vaccine status: Up to date  Covid-19 vaccine status: Completed vaccines  Qualifies for Shingles Vaccine? Yes   Zostavax completed Yes   Shingrix Completed?: Yes  Screening Tests Health Maintenance  Topic Date Due   COVID-19 Vaccine (4 - 2023-24 season) 03/16/2022   Zoster Vaccines- Shingrix (2 of 2) 09/28/2022   COLONOSCOPY (Pts 45-78yr Insurance coverage will need to be confirmed)  11/07/2022   Medicare Annual Wellness (AWV)  08/25/2023   DTaP/Tdap/Td (3 - Td or Tdap) 08/05/2031   Pneumonia Vaccine 82 Years old  Completed   INFLUENZA VACCINE  Completed   HPV VACCINES  Aged Out    Health Maintenance  Health Maintenance Due  Topic Date Due   COVID-19 Vaccine (4 - 2023-24 season) 03/16/2022    Colorectal cancer screening: No longer required.   Lung Cancer Screening: (Low Dose CT Chest recommended if Age  81-80years, 30 pack-year currently smoking OR have quit w/in 15years.) does not qualify.   Lung Cancer Screening Referral: n/a  Additional Screening:  Hepatitis C Screening: does not qualify;   Vision Screening: Recommended annual ophthalmology exams for early detection of glaucoma and other disorders of the eye. Is the patient up to date with their annual eye exam?  Yes  Who is the provider or what is the name of the office in which the patient attends annual eye exams? Dr.Johnson  If pt is not established with a provider, would they like to be referred to a provider to establish care? No .   Dental Screening: Recommended annual dental exams for proper oral hygiene  Community Resource Referral / Chronic Care Management: CRR required this visit?  No   CCM required this visit?  No      Plan:     I have personally reviewed and noted the following in the patient's chart:   Medical and social history Use of alcohol, tobacco or illicit drugs  Current medications and supplements including opioid prescriptions. Patient is not currently taking opioid prescriptions. Functional ability and status Nutritional status Physical activity Advanced directives List of other physicians Hospitalizations, surgeries, and ER visits in previous 12 months Vitals Screenings to include cognitive, depression, and falls Referrals and appointments  In addition, I have reviewed and discussed with patient certain preventive protocols, quality metrics, and best practice recommendations. A written personalized care plan for preventive services as well as general preventive health recommendations were provided to patient.     LDaphane Shepherd LPN   [id number]  Nurse Notes: none

## 2022-08-30 DIAGNOSIS — Z08 Encounter for follow-up examination after completed treatment for malignant neoplasm: Secondary | ICD-10-CM | POA: Diagnosis not present

## 2022-08-30 DIAGNOSIS — L570 Actinic keratosis: Secondary | ICD-10-CM | POA: Diagnosis not present

## 2022-08-30 DIAGNOSIS — Z85828 Personal history of other malignant neoplasm of skin: Secondary | ICD-10-CM | POA: Diagnosis not present

## 2022-08-30 DIAGNOSIS — X32XXXD Exposure to sunlight, subsequent encounter: Secondary | ICD-10-CM | POA: Diagnosis not present

## 2022-10-02 ENCOUNTER — Encounter (INDEPENDENT_AMBULATORY_CARE_PROVIDER_SITE_OTHER): Payer: Self-pay | Admitting: *Deleted

## 2022-12-27 DIAGNOSIS — C44319 Basal cell carcinoma of skin of other parts of face: Secondary | ICD-10-CM | POA: Diagnosis not present

## 2022-12-27 DIAGNOSIS — L570 Actinic keratosis: Secondary | ICD-10-CM | POA: Diagnosis not present

## 2022-12-27 DIAGNOSIS — X32XXXD Exposure to sunlight, subsequent encounter: Secondary | ICD-10-CM | POA: Diagnosis not present

## 2023-02-01 ENCOUNTER — Ambulatory Visit: Payer: Medicare Other | Admitting: Family Medicine

## 2023-02-04 ENCOUNTER — Other Ambulatory Visit: Payer: Self-pay | Admitting: Family Medicine

## 2023-02-04 DIAGNOSIS — E782 Mixed hyperlipidemia: Secondary | ICD-10-CM

## 2023-02-04 DIAGNOSIS — L570 Actinic keratosis: Secondary | ICD-10-CM | POA: Diagnosis not present

## 2023-02-04 DIAGNOSIS — Z85828 Personal history of other malignant neoplasm of skin: Secondary | ICD-10-CM | POA: Diagnosis not present

## 2023-02-04 DIAGNOSIS — X32XXXD Exposure to sunlight, subsequent encounter: Secondary | ICD-10-CM | POA: Diagnosis not present

## 2023-02-04 DIAGNOSIS — Z08 Encounter for follow-up examination after completed treatment for malignant neoplasm: Secondary | ICD-10-CM | POA: Diagnosis not present

## 2023-03-01 ENCOUNTER — Other Ambulatory Visit: Payer: Self-pay | Admitting: Family Medicine

## 2023-03-01 DIAGNOSIS — E782 Mixed hyperlipidemia: Secondary | ICD-10-CM

## 2023-03-08 ENCOUNTER — Encounter: Payer: Self-pay | Admitting: Family Medicine

## 2023-03-08 ENCOUNTER — Ambulatory Visit (INDEPENDENT_AMBULATORY_CARE_PROVIDER_SITE_OTHER): Payer: Medicare Other | Admitting: Family Medicine

## 2023-03-08 VITALS — BP 189/76 | HR 68 | Ht 72.0 in | Wt 176.0 lb

## 2023-03-08 DIAGNOSIS — E782 Mixed hyperlipidemia: Secondary | ICD-10-CM | POA: Diagnosis not present

## 2023-03-08 DIAGNOSIS — F03B0 Unspecified dementia, moderate, without behavioral disturbance, psychotic disturbance, mood disturbance, and anxiety: Secondary | ICD-10-CM

## 2023-03-08 DIAGNOSIS — Z23 Encounter for immunization: Secondary | ICD-10-CM

## 2023-03-08 DIAGNOSIS — R413 Other amnesia: Secondary | ICD-10-CM

## 2023-03-08 DIAGNOSIS — E039 Hypothyroidism, unspecified: Secondary | ICD-10-CM | POA: Diagnosis not present

## 2023-03-08 DIAGNOSIS — K219 Gastro-esophageal reflux disease without esophagitis: Secondary | ICD-10-CM

## 2023-03-08 DIAGNOSIS — F03918 Unspecified dementia, unspecified severity, with other behavioral disturbance: Secondary | ICD-10-CM | POA: Diagnosis not present

## 2023-03-08 MED ORDER — ATORVASTATIN CALCIUM 20 MG PO TABS: 20.0000 mg | ORAL_TABLET | Freq: Every day | ORAL | 3 refills | Status: DC

## 2023-03-08 MED ORDER — MEMANTINE HCL ER 21 MG PO CP24: 21.0000 mg | ORAL_CAPSULE | Freq: Every day | ORAL | 3 refills | Status: DC

## 2023-03-08 MED ORDER — LEVOTHYROXINE SODIUM 88 MCG PO TABS: 88.0000 ug | ORAL_TABLET | Freq: Every day | ORAL | 3 refills | Status: DC

## 2023-03-08 MED ORDER — OMEPRAZOLE 20 MG PO CPDR: 20.0000 mg | DELAYED_RELEASE_CAPSULE | Freq: Every day | ORAL | 3 refills | Status: DC

## 2023-03-08 NOTE — Progress Notes (Signed)
BP (!) 189/76   Pulse 68   Ht 6' (1.829 m)   Wt 176 lb (79.8 kg)   SpO2 97%   BMI 23.87 kg/m    Subjective:   Patient ID: Christian Johnson, male    DOB: [birth year], 82 y.o.   MRN: [id number]  HPI: Christian Johnson is a 81 y.o. male presenting on 03/08/2023 for Medical Management of Chronic Issues, Hypothyroidism, Hyperlipidemia, and Hypertension   HPI Hypothyroidism recheck Patient is coming in for thyroid recheck today as well. They deny any issues with hair changes or heat or cold problems or diarrhea or constipation. They deny any chest pain or palpitations. They are currently on levothyroxine 88 micrograms   Hyperlipidemia Patient is coming in for recheck of his hyperlipidemia. The patient is currently taking fish oil and atorvastatin. They deny any issues with myalgias or history of liver damage from it. They deny any focal numbness or weakness or chest pain.   GERD Patient is currently on omeprazole.  She denies any major symptoms or abdominal pain or belching or burping. She denies any blood in her stool or lightheadedness or dizziness.   Dementia recheck Patient is coming in for dementia recheck today.  He is currently taking Namenda for this.  Per wife, seems to be worsening.  He is still taking Namenda but she does not know if it is helping.  He is not have any side effects we will continue taking it.    08/23/2021    9:32 AM 10/31/2020    2:15 PM 12/25/2019    9:32 AM  MMSE - Mini Mental State Exam  Not completed: Unable to complete    Orientation to time  3 5  Orientation to Place  2 2  Registration  3 3  Attention/ Calculation  4 0  Recall  3 1  Language- name 2 objects  2 2  Language- repeat  1 1  Language- follow 3 step command  2 3  Language- read & follow direction  1 1  Write a sentence  1 1  Copy design  1 1  Total score  23 20     Relevant past medical, surgical, family and social history reviewed and updated as indicated. Interim medical history since our  last visit reviewed. Allergies and medications reviewed and updated.  Review of Systems  Constitutional:  Negative for chills and fever.  Eyes:  Negative for visual disturbance.  Respiratory:  Negative for shortness of breath and wheezing.   Cardiovascular:  Negative for chest pain and leg swelling.  Musculoskeletal:  Negative for back pain and gait problem.  Skin:  Negative for rash.  Neurological:  Negative for dizziness, weakness and light-headedness.  All other systems reviewed and are negative.   Per HPI unless specifically indicated above   Allergies as of 03/08/2023   No Known Allergies      Medication List        Accurate as of March 08, 2023  9:28 AM. If you have any questions, ask your nurse or doctor.          atorvastatin 20 MG tablet Commonly known as: LIPITOR Take 1 tablet (20 mg total) by mouth daily.   calcium-vitamin D 500-200 MG-UNIT tablet Commonly known as: OSCAL WITH D Take 1 tablet by mouth daily.   cyanocobalamin 500 MCG tablet Commonly known as: VITAMIN B12 Take 500 mcg by mouth daily.   Fish Oil 500 MG Caps Take 500 mg by mouth  daily.   GenTeal Mild 0.2 % Soln Generic drug: Hypromellose Place 1 drop into both eyes daily.   levothyroxine 88 MCG tablet Commonly known as: SYNTHROID Take 1 tablet (88 mcg total) by mouth daily.   meclizine 12.5 MG tablet Commonly known as: ANTIVERT TAKE 1 TABLET BY MOUTH 3 TIMES DAILY AS NEEDED FOR DIZZINESS.   memantine 21 MG Cp24 24 hr capsule Commonly known as: NAMENDA XR Take 1 capsule (21 mg total) by mouth daily.   multivitamin with minerals Tabs tablet Take 1 tablet by mouth daily. Centrum Silver   naproxen sodium 220 MG tablet Commonly known as: ALEVE Take 220 mg by mouth daily as needed (for pain or headache).   omeprazole 20 MG capsule Commonly known as: PRILOSEC Take 1 capsule (20 mg total) by mouth daily.   senna 8.6 MG Tabs tablet Commonly known as: SENOKOT Take 1 tablet by  mouth daily as needed for mild constipation.         Objective:   BP (!) 189/76   Pulse 68   Ht 6' (1.829 m)   Wt 176 lb (79.8 kg)   SpO2 97%   BMI 23.87 kg/m   Wt Readings from Last 3 Encounters:  03/08/23 176 lb (79.8 kg)  08/24/22 185 lb (83.9 kg)  08/03/22 183 lb (83 kg)    Physical Exam Vitals and nursing note reviewed.  Constitutional:      General: He is not in acute distress.    Appearance: He is well-developed. He is not diaphoretic.  Eyes:     General: No scleral icterus.    Conjunctiva/sclera: Conjunctivae normal.  Neck:     Thyroid: No thyromegaly.  Cardiovascular:     Rate and Rhythm: Normal rate and regular rhythm.     Heart sounds: Normal heart sounds. No murmur heard. Pulmonary:     Effort: Pulmonary effort is normal. No respiratory distress.     Breath sounds: Normal breath sounds. No wheezing.  Musculoskeletal:        General: No swelling. Normal range of motion.     Cervical back: Neck supple.  Lymphadenopathy:     Cervical: No cervical adenopathy.  Skin:    General: Skin is warm and dry.     Findings: No rash.  Neurological:     Mental Status: He is alert and oriented to person, place, and time.     Coordination: Coordination normal.  Psychiatric:        Behavior: Behavior normal.       Assessment & Plan:   Problem List Items Addressed This Visit       Digestive   GERD (gastroesophageal reflux disease)   Relevant Medications   omeprazole (PRILOSEC) 20 MG capsule   Other Relevant Orders   CBC with Differential/Platelet     Endocrine   Hypothyroidism - Primary   Relevant Medications   levothyroxine (SYNTHROID) 88 MCG tablet   Other Relevant Orders   TSH     Nervous and Auditory   Dementia (HCC)   Relevant Medications   memantine (NAMENDA XR) 21 MG CP24 24 hr capsule     Other   Hyperlipidemia   Relevant Medications   atorvastatin (LIPITOR) 20 MG tablet   Other Relevant Orders   CBC with Differential/Platelet    CMP14+EGFR   Lipid panel   Other Visit Diagnoses     Need for shingles vaccine       Relevant Orders   Zoster Recombinant (Shingrix ) (Completed)  Dementia with behavioral disturbance (HCC)       Relevant Medications   memantine (NAMENDA XR) 21 MG CP24 24 hr capsule   Memory difficulties       Relevant Medications   memantine (NAMENDA XR) 21 MG CP24 24 hr capsule     Continue current medicine, no changes, will check blood work today.  Follow up plan: Return in about 6 months (around 09/08/2023), or if symptoms worsen or fail to improve, for Thyroid and cholesterol GERD recheck.  Counseling provided for all of the vaccine components Orders Placed This Encounter  Procedures   Zoster Recombinant (Shingrix )   CBC with Differential/Platelet   CMP14+EGFR   Lipid panel   TSH    Arville Care, MD [facility] 03/08/2023, 9:28 AM

## 2023-03-09 LAB — CBC WITH DIFFERENTIAL/PLATELET
Basophils Absolute: 0 10*3/uL (ref 0.0–0.2)
Basos: 1 %
EOS (ABSOLUTE): 0.2 10*3/uL (ref 0.0–0.4)
Eos: 4 %
Hematocrit: 39.3 % (ref 37.5–51.0)
Hemoglobin: 13.7 g/dL (ref 13.0–17.7)
Immature Grans (Abs): 0 10*3/uL (ref 0.0–0.1)
Immature Granulocytes: 0 %
Lymphocytes Absolute: 0.8 10*3/uL (ref 0.7–3.1)
Lymphs: 16 %
MCH: 33 pg (ref 26.6–33.0)
MCHC: 34.9 g/dL (ref 31.5–35.7)
MCV: 95 fL (ref 79–97)
Monocytes Absolute: 0.6 10*3/uL (ref 0.1–0.9)
Monocytes: 12 %
Neutrophils Absolute: 3.4 10*3/uL (ref 1.4–7.0)
Neutrophils: 67 %
Platelets: 145 10*3/uL — ABNORMAL LOW (ref 150–450)
RBC: 4.15 x10E6/uL (ref 4.14–5.80)
RDW: 13.4 % (ref 11.6–15.4)
WBC: 5 10*3/uL (ref 3.4–10.8)

## 2023-03-09 LAB — CMP14+EGFR
ALT: 10 IU/L (ref 0–44)
AST: 23 IU/L (ref 0–40)
Albumin: 4 g/dL (ref 3.7–4.7)
Alkaline Phosphatase: 127 IU/L — ABNORMAL HIGH (ref 44–121)
BUN/Creatinine Ratio: 15 (ref 10–24)
BUN: 14 mg/dL (ref 8–27)
Bilirubin Total: 1.3 mg/dL — ABNORMAL HIGH (ref 0.0–1.2)
CO2: 27 mmol/L (ref 20–29)
Calcium: 9 mg/dL (ref 8.6–10.2)
Chloride: 105 mmol/L (ref 96–106)
Creatinine, Ser: 0.94 mg/dL (ref 0.76–1.27)
Globulin, Total: 1.8 g/dL (ref 1.5–4.5)
Glucose: 89 mg/dL (ref 70–99)
Potassium: 3.9 mmol/L (ref 3.5–5.2)
Sodium: 145 mmol/L — ABNORMAL HIGH (ref 134–144)
Total Protein: 5.8 g/dL — ABNORMAL LOW (ref 6.0–8.5)
eGFR: 81 mL/min/{1.73_m2} (ref >59)

## 2023-03-09 LAB — LIPID PANEL
Chol/HDL Ratio: 2.2 ratio (ref 0.0–5.0)
Cholesterol, Total: 118 mg/dL (ref 100–199)
HDL: 53 mg/dL (ref >39)
LDL Chol Calc (NIH): 50 mg/dL (ref 0–99)
Triglycerides: 73 mg/dL (ref 0–149)
VLDL Cholesterol Cal: 15 mg/dL (ref 5–40)

## 2023-03-09 LAB — TSH: TSH: 1.49 u[IU]/mL (ref 0.450–4.500)

## 2023-08-05 DIAGNOSIS — Z08 Encounter for follow-up examination after completed treatment for malignant neoplasm: Secondary | ICD-10-CM | POA: Diagnosis not present

## 2023-08-05 DIAGNOSIS — X32XXXD Exposure to sunlight, subsequent encounter: Secondary | ICD-10-CM | POA: Diagnosis not present

## 2023-08-05 DIAGNOSIS — L570 Actinic keratosis: Secondary | ICD-10-CM | POA: Diagnosis not present

## 2023-08-05 DIAGNOSIS — Z85828 Personal history of other malignant neoplasm of skin: Secondary | ICD-10-CM | POA: Diagnosis not present

## 2023-08-24 DIAGNOSIS — Z20822 Contact with and (suspected) exposure to covid-19: Secondary | ICD-10-CM | POA: Diagnosis not present

## 2023-08-24 DIAGNOSIS — R5381 Other malaise: Secondary | ICD-10-CM | POA: Diagnosis not present

## 2023-08-24 DIAGNOSIS — Z87891 Personal history of nicotine dependence: Secondary | ICD-10-CM | POA: Diagnosis not present

## 2023-08-24 DIAGNOSIS — R110 Nausea: Secondary | ICD-10-CM | POA: Diagnosis not present

## 2023-08-24 DIAGNOSIS — Z743 Need for continuous supervision: Secondary | ICD-10-CM | POA: Diagnosis not present

## 2023-08-24 DIAGNOSIS — R1110 Vomiting, unspecified: Secondary | ICD-10-CM | POA: Diagnosis not present

## 2023-08-24 DIAGNOSIS — R059 Cough, unspecified: Secondary | ICD-10-CM | POA: Diagnosis not present

## 2023-08-24 DIAGNOSIS — R531 Weakness: Secondary | ICD-10-CM | POA: Diagnosis not present

## 2023-08-24 DIAGNOSIS — R404 Transient alteration of awareness: Secondary | ICD-10-CM | POA: Diagnosis not present

## 2023-08-27 ENCOUNTER — Ambulatory Visit (INDEPENDENT_AMBULATORY_CARE_PROVIDER_SITE_OTHER): Payer: Medicare Other

## 2023-08-27 VITALS — Ht 72.0 in | Wt 176.0 lb

## 2023-08-27 DIAGNOSIS — Z0000 Encounter for general adult medical examination without abnormal findings: Secondary | ICD-10-CM | POA: Diagnosis not present

## 2023-08-27 NOTE — Progress Notes (Signed)
Subjective:   Christian Johnson is a 83 y.o. male who presents for Medicare Annual/Subsequent preventive examination.  Visit Complete: Virtual I connected with  Rayburn Ma on 08/27/23 by a audio enabled telemedicine application and verified that I am speaking with the correct person using two identifiers.  Patient Location: Home  Provider Location: Home Office  Interactive audio and video telecommunications were attempted between this provider and patient, however failed, due to patient having technical difficulties OR patient did not have access to video capability.  We continued and completed visit with audio only.  I discussed the limitations of evaluation and management by telemedicine. The patient expressed understanding and agreed to proceed.  Vital Signs: Because this visit was a virtual/telehealth visit, some criteria may be missing or patient reported. Any vitals not documented were not able to be obtained and vitals that have been documented are patient reported.  Cardiac Risk Factors include: advanced age (>39men, >7 women);dyslipidemia;hypertension;male gender     Objective:    Today's Vitals   08/27/23 1509  Weight: 176 lb (79.8 kg)  Height: 6' (1.829 m)   Body mass index is 23.87 kg/m.     08/27/2023    3:14 PM 08/24/2022    9:17 AM 08/23/2021    9:31 AM 08/22/2020    8:14 AM 08/20/2019    8:41 AM 03/09/2019   10:04 AM 02/24/2019   11:02 AM  Advanced Directives  Does Patient Have a Medical Advance Directive? No No No No No No No  Would patient like information on creating a medical advance directive? Yes (MAU/Ambulatory/Procedural Areas - Information given) No - Patient declined No - Patient declined No - Patient declined No - Patient declined No - Patient declined No - Patient declined    Current Medications (verified) Outpatient Encounter Medications as of 08/27/2023  Medication Sig   atorvastatin (LIPITOR) 20 MG tablet Take 1 tablet (20 mg total) by mouth daily.    calcium-vitamin D (OSCAL WITH D) 500-200 MG-UNIT tablet Take 1 tablet by mouth daily.   GENTEAL MILD 0.2 % SOLN Place 1 drop into both eyes daily.   levothyroxine (SYNTHROID) 88 MCG tablet Take 1 tablet (88 mcg total) by mouth daily.   meclizine (ANTIVERT) 12.5 MG tablet TAKE 1 TABLET BY MOUTH 3 TIMES DAILY AS NEEDED FOR DIZZINESS.   memantine (NAMENDA XR) 21 MG CP24 24 hr capsule Take 1 capsule (21 mg total) by mouth daily.   Multiple Vitamin (MULTIVITAMIN WITH MINERALS) TABS Take 1 tablet by mouth daily. Centrum Silver   naproxen sodium (ALEVE) 220 MG tablet Take 220 mg by mouth daily as needed (for pain or headache).    Omega-3 Fatty Acids (FISH OIL) 500 MG CAPS Take 500 mg by mouth daily.   omeprazole (PRILOSEC) 20 MG capsule Take 1 capsule (20 mg total) by mouth daily.   oseltamivir (TAMIFLU) 75 MG capsule Take 75 mg by mouth daily.   senna (SENOKOT) 8.6 MG TABS tablet Take 1 tablet by mouth daily as needed for mild constipation.   vitamin B-12 (CYANOCOBALAMIN) 500 MCG tablet Take 500 mcg by mouth daily.   No facility-administered encounter medications on file as of 08/27/2023.    Allergies (verified) Patient has no known allergies.   History: Past Medical History:  Diagnosis Date   Cancer [facility])    Prostate   Cataract    Colon polyps    Dementia (HCC)    GERD (gastroesophageal reflux disease)    Hyperlipidemia    Hypothyroidism  Memory change    Prostate cancer [facility])    Past Surgical History:  Procedure Laterality Date   CATARACT EXTRACTION W/PHACO Left 03/09/2019   Procedure: CATARACT EXTRACTION PHACO AND INTRAOCULAR LENS PLACEMENT (IOC);  Surgeon: Fabio Pierce, MD;  Location: [facility] ORS;  Service: Ophthalmology;  Laterality: Left;  left, CDE: 7.70   COLONOSCOPY  01/31/2012   Procedure: COLONOSCOPY;  Surgeon: Malissa Hippo, MD;  Location: [facility];  Service: Endoscopy;  Laterality: N/A;  930   COLONOSCOPY N/A 11/06/2017   Procedure: COLONOSCOPY;  Surgeon: Malissa Hippo, MD;  Location: [facility];  Service: Endoscopy;  Laterality: N/A;  830   Colonoscopy with polyp removal     EYE SURGERY Left 07/08/2018   cataracts   POLYPECTOMY  11/06/2017   Procedure: POLYPECTOMY;  Surgeon: Malissa Hippo, MD;  Location: [facility];  Service: Endoscopy;;  cecal (CB), transverse colon (CB), rectal polyp (HS)   Right hip core decompression     Family History  Problem Relation Age of Onset   Breast cancer Mother    Cancer Father        Lung   Hypertension Son    Kidney disease Son    Kidney cancer Son    Colon cancer Neg Hx    Social History   Socioeconomic History   Marital status: Married    Spouse name: Bonita Quin   Number of children: 2   Years of education: 12   Highest education level: High school graduate  Occupational History   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  Tobacco Use   Smoking status: Former    Current packs/day: 0.00    Average packs/day: 1 pack/day for 25.0 years (25.0 ttl pk-yrs)    Types: Cigarettes    Start date: 02/23/1974    Quit date: 02/24/1999    Years since quitting: 24.5   Smokeless tobacco: Never  Vaping Use   Vaping status: Never Used  Substance and Sexual Activity   Alcohol use: No   Drug use: No   Sexual activity: Not Currently  Other Topics Concern   Not on file  Social History Narrative   Lives with wife - has basement   Son and daughter live on same street as them   Social Drivers of Health   Financial Resource Strain: Low Risk  (08/27/2023)   Overall Financial Resource Strain (CARDIA)    Difficulty of Paying Living Expenses: Not hard at all  Food Insecurity: No Food Insecurity (08/27/2023)   Hunger Vital Sign    Worried About Running Out of Food in the Last Year: Never true    Ran Out of Food in the Last Year: Never true  Transportation Needs: No Transportation Needs (08/27/2023)   PRAPARE - Administrator, Civil Service (Medical): No    Lack of Transportation  (Non-Medical): No  Physical Activity: Inactive (08/27/2023)   Exercise Vital Sign    Days of Exercise per Week: 0 days    Minutes of Exercise per Session: 0 min  Stress: No Stress Concern Present (08/27/2023)   [employer] of Occupational Health - Occupational Stress Questionnaire    Feeling of Stress : Not at all  Social Connections: Moderately Integrated (08/27/2023)   Social Connection and Isolation Panel [NHANES]    Frequency of Communication with Friends and Family: More than three times a week    Frequency of Social Gatherings with Friends and Family: Three times a week  Attends Religious Services: 1 to 4 times per year    Active Member of Clubs or Organizations: No    Attends Banker Meetings: Never    Marital Status: Married    Tobacco Counseling Counseling given: Not Answered   Clinical Intake:  Pre-visit preparation completed: Yes  Pain : No/denies pain     Diabetes: No  How often do you need to have someone help you when you read instructions, pamphlets, or other written materials from your doctor or pharmacy?: 1 - Never  Interpreter Needed?: No  Comments: Assisted with visit by spouse Information entered by :: Kandis Fantasia LPN   Activities of Daily Living    08/27/2023    3:11 PM  In your present state of health, do you have any difficulty performing the following activities:  Hearing? 0  Vision? 0  Difficulty concentrating or making decisions? 1  Walking or climbing stairs? 0  Dressing or bathing? 0  Doing errands, shopping? 1  Preparing Food and eating ? N  Using the Toilet? N  In the past six months, have you accidently leaked urine? N  Do you have problems with loss of bowel control? N  Managing your Medications? Y  Managing your Finances? Y  Housekeeping or managing your Housekeeping? N    Patient Care Team: Dettinger, Elige Radon, MD as PCP - General (Family Medicine)  Indicate any recent Medical Services you may have  received from other than [facility] providers in the past year (date may be approximate).     Assessment:   This is a routine wellness examination for Seon.  Hearing/Vision screen Hearing Screening - Comments:: Some hearing loss   Vision Screening - Comments:: Wears rx glasses - up to date with routine eye exams with [facility]     Goals Addressed             This Visit's Progress    COMPLETED: AWV       08/20/2019 AWV Goal: Fall Prevention  Over the next year, patient will decrease their risk for falls by: Using assistive devices, such as a cane or walker, as needed Identifying fall risks within their home and correcting them by: Removing throw rugs Adding handrails to stairs or ramps Removing clutter and keeping a clear pathway throughout the home Increasing light, especially at night Adding shower handles/bars Raising toilet seat Identifying potential personal risk factors for falls: Medication side effects Incontinence/urgency Vestibular dysfunction Hearing loss Musculoskeletal disorders Neurological disorders Orthostatic hypotension  08/20/2019 AWV Goal: Exercise for General Health  Patient will verbalize understanding of the benefits of increased physical activity: Exercising regularly is important. It will improve your overall fitness, flexibility, and endurance. Regular exercise also will improve your overall health. It can help you control your weight, reduce stress, and improve your bone density. Over the next year, patient will increase physical activity as tolerated with a goal of at least 150 minutes of moderate physical activity per week.  You can tell that you are exercising at a moderate intensity if your heart starts beating faster and you start breathing faster but can still hold a conversation. Moderate-intensity exercise ideas include: Walking 1 mile (1.6 km) in about 15 minutes Biking Hiking Golfing Dancing Water aerobics Patient will verbalize  understanding of everyday activities that increase physical activity by providing examples like the following: Yard work, such as: Production designer, theatre/television/film windows or floors Patient  will be able to explain general safety guidelines for exercising:  Before you start a new exercise program, talk with your health care provider. Do not exercise so much that you hurt yourself, feel dizzy, or get very short of breath. Wear comfortable clothes and wear shoes with good support. Drink plenty of water while you exercise to prevent dehydration or heat stroke. Work out until your breathing and your heartbeat get faster.         Depression Screen    08/27/2023    3:13 PM 03/08/2023    8:53 AM 08/24/2022    9:17 AM 08/03/2022    9:41 AM 02/01/2022   11:14 AM 08/23/2021    8:24 AM 08/04/2021   10:54 AM  PHQ 2/9 Scores  PHQ - 2 Score 1 2 0 1 1 2 2   PHQ- 9 Score 3 4 0   4 5    Fall Risk    08/27/2023    3:14 PM 03/08/2023    8:53 AM 08/24/2022    9:15 AM 08/03/2022    9:41 AM 02/01/2022   11:14 AM  Fall Risk   Falls in the past year? 0 0 0 0 0  Number falls in past yr: 0  0    Injury with Fall? 0  0    Risk for fall due to : No Fall Risks  No Fall Risks    Follow up Falls prevention discussed;Education provided;Falls evaluation completed  Falls prevention discussed      MEDICARE RISK AT HOME: Medicare Risk at Home Any stairs in or around the home?: No If so, are there any without handrails?: No Home free of loose throw rugs in walkways, pet beds, electrical cords, etc?: Yes Adequate lighting in your home to reduce risk of falls?: Yes Life alert?: No Use of a cane, walker or w/c?: No Grab bars in the bathroom?: Yes Shower chair or bench in shower?: No Elevated toilet seat or a handicapped toilet?: Yes  TIMED UP AND GO:  Was the test performed?  No    Cognitive Function: current cognitive  impairment diagnosis     08/23/2021    9:32 AM 10/31/2020    2:15 PM 12/25/2019    9:32 AM 04/10/2019   12:59 PM  MMSE - Mini Mental State Exam  Not completed: Unable to complete     Orientation to time  3 5 5   Orientation to Place  2 2 3   Registration  3 3 3   Attention/ Calculation  4 0 3  Recall  3 1 0  Language- name 2 objects  2 2 2   Language- repeat  1 1 1   Language- follow 3 step command  2 3 3   Language- read & follow direction  1 1 1   Write a sentence  1 1 1   Copy design  1 1 1   Total score  23 20 23         08/27/2023    3:15 PM 08/24/2022    9:18 AM 08/22/2020    8:07 AM 08/20/2019    8:47 AM 02/24/2019   11:05 AM  6CIT Screen  What Year? 4 points 4 points 0 points 0 points 0 points  What month? 3 points 3 points 0 points 0 points 0 points  What time? 0 points 3 points 0 points 0 points 0 points  Count back from 20 2 points 2 points 0 points 0 points 0 points  Months in reverse 2 points 2 points 4  points 4 points 4 points  Repeat phrase 6 points 2 points 10 points 6 points 4 points  Total Score 17 points 16 points 14 points 10 points 8 points    Immunizations Immunization History  Administered Date(s) Administered   Fluad Quad(high Dose 65+) 04/10/2019, 04/20/2020, 05/04/2021, 05/01/2022   Influenza, High Dose Seasonal PF 03/17/2015, 04/18/2016, 04/23/2017, 04/15/2018   Influenza-Unspecified 04/18/2016, 04/23/2017, 04/15/2018   Moderna SARS-COV2 Booster Vaccination 11/11/2019   Moderna Sars-Covid-2 Vaccination 10/14/2019, 11/11/2019   Pneumococcal Conjugate-13 07/27/2015   Pneumococcal Polysaccharide-23 12/19/2016   Tdap 01/01/2011, 08/04/2021   Zoster Recombinant(Shingrix) 08/03/2022, 03/08/2023    TDAP status: Up to date  Flu Vaccine status: Due, Education has been provided regarding the importance of this vaccine. Advised may receive this vaccine at local pharmacy or Health Dept. Aware to provide a copy of the vaccination record if obtained from local pharmacy  or Health Dept. Verbalized acceptance and understanding.  Pneumococcal vaccine status: Up to date  Covid-19 vaccine status: Information provided on how to obtain vaccines.   Qualifies for Shingles Vaccine? Yes   Zostavax completed No   Shingrix Completed?: Yes  Screening Tests Health Maintenance  Topic Date Due   INFLUENZA VACCINE  02/14/2023   COVID-19 Vaccine (4 - 2024-25 season) 03/17/2023   Colonoscopy  03/07/2024 (Originally 11/07/2022)   Medicare Annual Wellness (AWV)  08/26/2024   DTaP/Tdap/Td (3 - Td or Tdap) 08/05/2031   Pneumonia Vaccine 79+ Years old  Completed   Zoster Vaccines- Shingrix  Completed   HPV VACCINES  Aged Out    Health Maintenance  Health Maintenance Due  Topic Date Due   INFLUENZA VACCINE  02/14/2023   COVID-19 Vaccine (4 - 2024-25 season) 03/17/2023    Colorectal cancer screening: No longer required.   Lung Cancer Screening: (Low Dose CT Chest recommended if Age 66-80 years, 20 pack-year currently smoking OR have quit w/in 15years.) does not qualify.   Lung Cancer Screening Referral: n/a  Additional Screening:  Hepatitis C Screening: does not qualify  Vision Screening: Recommended annual ophthalmology exams for early detection of glaucoma and other disorders of the eye. Is the patient up to date with their annual eye exam?  Yes  Who is the provider or what is the name of the office in which the patient attends annual eye exams? [facility] If pt is not established with a provider, would they like to be referred to a provider to establish care? No .   Dental Screening: Recommended annual dental exams for proper oral hygiene  Community Resource Referral / Chronic Care Management: CRR required this visit?  No   CCM required this visit?  No     Plan:     I have personally reviewed and noted the following in the patient's chart:   Medical and social history Use of alcohol, tobacco or illicit drugs  Current medications and  supplements including opioid prescriptions. Patient is not currently taking opioid prescriptions. Functional ability and status Nutritional status Physical activity Advanced directives List of other physicians Hospitalizations, surgeries, and ER visits in previous 12 months Vitals Screenings to include cognitive, depression, and falls Referrals and appointments  In addition, I have reviewed and discussed with patient certain preventive protocols, quality metrics, and best practice recommendations. A written personalized care plan for preventive services as well as general preventive health recommendations were provided to patient.     Kandis Fantasia [place], [place]   [id number]/4696   After Visit Summary: (Declined) Due to this being a telephonic  visit, with patients personalized plan was offered to patient but patient Declined AVS at this time   Nurse Notes: No concerns at this time

## 2023-08-27 NOTE — Patient Instructions (Signed)
Christian Johnson , Thank you for taking time to come for your Medicare Wellness Visit. I appreciate your ongoing commitment to your health goals. Please review the following plan we discussed and let me know if I can assist you in the future.   Referrals/Orders/Follow-Ups/Clinician Recommendations: Aim for 30 minutes of exercise or brisk walking, 6-8 glasses of water, and 5 servings of fruits and vegetables each day.  This is a list of the screening recommended for you and due dates:  Health Maintenance  Topic Date Due   Flu Shot  02/14/2023   COVID-19 Vaccine (4 - 2024-25 season) 03/17/2023   Colon Cancer Screening  03/07/2024*   Medicare Annual Wellness Visit  08/26/2024   DTaP/Tdap/Td vaccine (3 - Td or Tdap) 08/05/2031   Pneumonia Vaccine  Completed   Zoster (Shingles) Vaccine  Completed   HPV Vaccine  Aged Out  *Topic was postponed. The date shown is not the original due date.    Advanced directives: (ACP Link)Information on Advanced Care Planning can be found at [facility] [place] Advance Health Care Directives Advance Health Care Directives ([email or web address]   Next Medicare Annual Wellness Visit scheduled for next year: Yes

## 2023-09-09 ENCOUNTER — Ambulatory Visit (INDEPENDENT_AMBULATORY_CARE_PROVIDER_SITE_OTHER): Payer: Medicare Other | Admitting: Family Medicine

## 2023-09-09 ENCOUNTER — Encounter: Payer: Self-pay | Admitting: Family Medicine

## 2023-09-09 VITALS — BP 168/74 | HR 68 | Ht 72.0 in | Wt 181.0 lb

## 2023-09-09 DIAGNOSIS — E782 Mixed hyperlipidemia: Secondary | ICD-10-CM | POA: Diagnosis not present

## 2023-09-09 DIAGNOSIS — F03B0 Unspecified dementia, moderate, without behavioral disturbance, psychotic disturbance, mood disturbance, and anxiety: Secondary | ICD-10-CM | POA: Diagnosis not present

## 2023-09-09 DIAGNOSIS — E039 Hypothyroidism, unspecified: Secondary | ICD-10-CM

## 2023-09-09 DIAGNOSIS — K219 Gastro-esophageal reflux disease without esophagitis: Secondary | ICD-10-CM | POA: Diagnosis not present

## 2023-09-09 DIAGNOSIS — I10 Essential (primary) hypertension: Secondary | ICD-10-CM | POA: Insufficient documentation

## 2023-09-09 MED ORDER — AMLODIPINE BESYLATE 5 MG PO TABS: 5.0000 mg | ORAL_TABLET | Freq: Every day | ORAL | 3 refills | Status: AC

## 2023-09-09 NOTE — Progress Notes (Signed)
 BP (!) 168/74   Pulse 68   Ht 6' (1.829 m)   Wt 181 lb (82.1 kg)   SpO2 96%   BMI 24.55 kg/m    Subjective:   Patient ID: Christian Johnson, male    DOB: 19-Sep-1940, 83 y.o.   MRN: [id number]  HPI: Christian Johnson is a 83 y.o. male presenting on 09/09/2023 for Medical Management of Chronic Issues   HPI Hypothyroidism recheck Patient is coming in for thyroid recheck today as well. They deny any issues with hair changes or heat or cold problems or diarrhea or constipation. They deny any chest pain or palpitations. They are currently on levothyroxine 88 micrograms   Hyperlipidemia Patient is coming in for recheck of his hyperlipidemia. The patient is currently taking atorvastatin. They deny any issues with myalgias or history of liver damage from it. They deny any focal numbness or weakness or chest pain.   GERD Patient is currently on omeprazole.  She denies any major symptoms or abdominal pain or belching or burping. She denies any blood in her stool or lightheadedness or dizziness.   Dementia recheck Patient is coming in today for dementia recheck.  He currently takes Namenda.  Memory has still been gradually worsening per his wife.  She feels like it has been gradual and he still can get up and take care of himself and do things for himself but just cannot remember many things.  She says he has a positive pleasant attitude and she has not had any issues with that.  Relevant past medical, surgical, family and social history reviewed and updated as indicated. Interim medical history since our last visit reviewed. Allergies and medications reviewed and updated.  Review of Systems  Constitutional:  Negative for chills and fever.  Eyes:  Negative for visual disturbance.  Respiratory:  Negative for shortness of breath and wheezing.   Cardiovascular:  Negative for chest pain and leg swelling.  Musculoskeletal:  Negative for back pain and gait problem.  Skin:  Negative for rash.   Neurological:  Negative for dizziness, weakness and light-headedness.  All other systems reviewed and are negative.   Per HPI unless specifically indicated above   Allergies as of 09/09/2023   No Known Allergies      Medication List        Accurate as of September 09, 2023 12:09 PM. If you have any questions, ask your nurse or doctor.          amLODipine 5 MG tablet Commonly known as: NORVASC Take 1 tablet (5 mg total) by mouth daily. Started by: Elige Radon Nakina Spatz   atorvastatin 20 MG tablet Commonly known as: LIPITOR Take 1 tablet (20 mg total) by mouth daily.   calcium-vitamin D 500-200 MG-UNIT tablet Commonly known as: OSCAL WITH D Take 1 tablet by mouth daily.   cyanocobalamin 500 MCG tablet Commonly known as: VITAMIN B12 Take 500 mcg by mouth daily.   Fish Oil 500 MG Caps Take 500 mg by mouth daily.   GenTeal Mild 0.2 % Soln Generic drug: Hypromellose Place 1 drop into both eyes daily.   levothyroxine 88 MCG tablet Commonly known as: SYNTHROID Take 1 tablet (88 mcg total) by mouth daily.   meclizine 12.5 MG tablet Commonly known as: ANTIVERT TAKE 1 TABLET BY MOUTH 3 TIMES DAILY AS NEEDED FOR DIZZINESS.   memantine 21 MG Cp24 24 hr capsule Commonly known as: NAMENDA XR Take 1 capsule (21 mg total) by mouth daily.  multivitamin with minerals Tabs tablet Take 1 tablet by mouth daily. Centrum Silver   naproxen sodium 220 MG tablet Commonly known as: ALEVE Take 220 mg by mouth daily as needed (for pain or headache).   omeprazole 20 MG capsule Commonly known as: PRILOSEC Take 1 capsule (20 mg total) by mouth daily.   senna 8.6 MG Tabs tablet Commonly known as: SENOKOT Take 1 tablet by mouth daily as needed for mild constipation.         Objective:   BP (!) 168/74   Pulse 68   Ht 6' (1.829 m)   Wt 181 lb (82.1 kg)   SpO2 96%   BMI 24.55 kg/m   Wt Readings from Last 3 Encounters:  09/09/23 181 lb (82.1 kg)  08/27/23 176 lb (79.8  kg)  03/08/23 176 lb (79.8 kg)    Physical Exam Vitals and nursing note reviewed.  Constitutional:      General: He is not in acute distress.    Appearance: He is well-developed. He is not diaphoretic.  Eyes:     General: No scleral icterus.    Conjunctiva/sclera: Conjunctivae normal.  Neck:     Thyroid: No thyromegaly.  Cardiovascular:     Rate and Rhythm: Normal rate and regular rhythm.     Heart sounds: Normal heart sounds. No murmur heard. Pulmonary:     Effort: Pulmonary effort is normal. No respiratory distress.     Breath sounds: Normal breath sounds. No wheezing.  Musculoskeletal:        General: No swelling. Normal range of motion.     Cervical back: Neck supple.  Lymphadenopathy:     Cervical: No cervical adenopathy.  Skin:    General: Skin is warm and dry.     Findings: No rash.  Neurological:     Mental Status: He is alert and oriented to person, place, and time.     Coordination: Coordination normal.  Psychiatric:        Behavior: Behavior normal.       Assessment & Plan:   Problem List Items Addressed This Visit       Cardiovascular and Mediastinum   Primary hypertension   Relevant Medications   amLODipine (NORVASC) 5 MG tablet     Digestive   GERD (gastroesophageal reflux disease)   Relevant Orders   CBC with Differential/Platelet     Endocrine   Hypothyroidism - Primary   Relevant Orders   CBC with Differential/Platelet   TSH     Nervous and Auditory   Dementia (HCC)   Relevant Orders   CBC with Differential/Platelet     Other   Hyperlipidemia   Relevant Medications   amLODipine (NORVASC) 5 MG tablet   Other Relevant Orders   CBC with Differential/Platelet   CMP14+EGFR   Lipid panel    Blood pressure is elevated today and that he does not usually have blood pressure issues, it was elevated the last time as well though.  She says she does check it at home and she thinks it is lower at home.  We will try and start a low-dose  amlodipine and have a monitor closely we will keep an eye on it. Follow up plan: Return in about 6 months (around 03/08/2024), or if symptoms worsen or fail to improve, for Hypertension and hypothyroidism and hyperlipidemia and dementia.  Counseling provided for all of the vaccine components Orders Placed This Encounter  Procedures   CBC with Differential/Platelet   CMP14+EGFR   Lipid  panel   TSH    Arville Care, MD [facility] 09/09/2023, 12:09 PM

## 2023-09-10 LAB — CMP14+EGFR
ALT: 9 [IU]/L (ref 0–44)
AST: 20 [IU]/L (ref 0–40)
Albumin: 3.8 g/dL (ref 3.7–4.7)
Alkaline Phosphatase: 116 [IU]/L (ref 44–121)
BUN/Creatinine Ratio: 12 (ref 10–24)
BUN: 11 mg/dL (ref 8–27)
Bilirubin Total: 1 mg/dL (ref 0.0–1.2)
CO2: 28 mmol/L (ref 20–29)
Calcium: 8.8 mg/dL (ref 8.6–10.2)
Chloride: 103 mmol/L (ref 96–106)
Creatinine, Ser: 0.95 mg/dL (ref 0.76–1.27)
Globulin, Total: 2 g/dL (ref 1.5–4.5)
Glucose: 91 mg/dL (ref 70–99)
Potassium: 3.8 mmol/L (ref 3.5–5.2)
Sodium: 144 mmol/L (ref 134–144)
Total Protein: 5.8 g/dL — ABNORMAL LOW (ref 6.0–8.5)
eGFR: 80 mL/min/{1.73_m2} (ref >59)

## 2023-09-10 LAB — CBC WITH DIFFERENTIAL/PLATELET
Basophils Absolute: 0 10*3/uL (ref 0.0–0.2)
Basos: 1 %
EOS (ABSOLUTE): 0.1 10*3/uL (ref 0.0–0.4)
Eos: 2 %
Hematocrit: 38.7 % (ref 37.5–51.0)
Hemoglobin: 12.9 g/dL — ABNORMAL LOW (ref 13.0–17.7)
Immature Grans (Abs): 0 10*3/uL (ref 0.0–0.1)
Immature Granulocytes: 0 %
Lymphocytes Absolute: 1 10*3/uL (ref 0.7–3.1)
Lymphs: 17 %
MCH: 32.5 pg (ref 26.6–33.0)
MCHC: 33.3 g/dL (ref 31.5–35.7)
MCV: 98 fL — ABNORMAL HIGH (ref 79–97)
Monocytes Absolute: 0.6 10*3/uL (ref 0.1–0.9)
Monocytes: 10 %
Neutrophils Absolute: 4.2 10*3/uL (ref 1.4–7.0)
Neutrophils: 70 %
Platelets: 201 10*3/uL (ref 150–450)
RBC: 3.97 x10E6/uL — ABNORMAL LOW (ref 4.14–5.80)
RDW: 12.7 % (ref 11.6–15.4)
WBC: 6 10*3/uL (ref 3.4–10.8)

## 2023-09-10 LAB — LIPID PANEL
Chol/HDL Ratio: 2.7 {ratio} (ref 0.0–5.0)
Cholesterol, Total: 144 mg/dL (ref 100–199)
HDL: 54 mg/dL (ref >39)
LDL Chol Calc (NIH): 74 mg/dL (ref 0–99)
Triglycerides: 86 mg/dL (ref 0–149)
VLDL Cholesterol Cal: 16 mg/dL (ref 5–40)

## 2023-09-10 LAB — TSH: TSH: 15.9 u[IU]/mL — ABNORMAL HIGH (ref 0.450–4.500)
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
## 2024-01-08 ENCOUNTER — Other Ambulatory Visit: Payer: Self-pay | Admitting: Family Medicine

## 2024-01-08 DIAGNOSIS — E039 Hypothyroidism, unspecified: Secondary | ICD-10-CM

## 2024-01-27 ENCOUNTER — Telehealth: Payer: Self-pay

## 2024-01-27 NOTE — Telephone Encounter (Signed)
 Copied from CRM [id number]. Topic: Medical Record Request - Provider/Facility Request >> Jan 27, 2024  2:57 PM Delon DASEN wrote: Reason for CRM: Brandy with [facility] Asst living- need med list, H&P, immunizations, last progress notes- FL2 stating memory care-  fax [phone]  phone [phone]

## 2024-01-27 NOTE — Telephone Encounter (Signed)
 Dr. Maryanne, do you need to see the pt prior to completing FL2 form?

## 2024-01-28 NOTE — Telephone Encounter (Signed)
 Called back today to see the status of the information that she is needing. She is wanting to add her email address as well, [email or web address].  Please advise

## 2024-01-29 NOTE — Telephone Encounter (Signed)
 I think we can do the rest based off for his visit earlier this year in February.  Will watch for forms

## 2024-01-30 NOTE — Telephone Encounter (Signed)
 FL2 typed up, other information printed, placed on PCPs desk

## 2024-01-30 NOTE — Telephone Encounter (Signed)
 FL2 and other requested information faxed & emailed to [facility]

## 2024-02-03 ENCOUNTER — Telehealth: Payer: Self-pay

## 2024-02-03 NOTE — Telephone Encounter (Signed)
 This was not something they had told me about before but if they want us  to do a letter of incompetency saying that he has dementia and has difficulty managing his own finances and health care then we can do that.

## 2024-02-03 NOTE — Telephone Encounter (Signed)
 Letter completed and son [name] made aware.

## 2024-02-03 NOTE — Telephone Encounter (Signed)
 Pts son called in regards to completion of a competency letter for the pt. Son states that he and his sister are wanting to manage pts healthcare.  At this time, there is documentation of an FL2 form and other paperwork being faxed to [facility].  Donny, is this something you were working on? I will speak with Dr. Maryanne as well.

## 2024-02-18 DIAGNOSIS — I10 Essential (primary) hypertension: Secondary | ICD-10-CM | POA: Diagnosis not present

## 2024-02-18 DIAGNOSIS — K219 Gastro-esophageal reflux disease without esophagitis: Secondary | ICD-10-CM | POA: Diagnosis not present

## 2024-02-18 DIAGNOSIS — K5901 Slow transit constipation: Secondary | ICD-10-CM | POA: Diagnosis not present

## 2024-02-18 DIAGNOSIS — E782 Mixed hyperlipidemia: Secondary | ICD-10-CM | POA: Diagnosis not present

## 2024-02-18 DIAGNOSIS — R296 Repeated falls: Secondary | ICD-10-CM | POA: Diagnosis not present

## 2024-02-18 DIAGNOSIS — E039 Hypothyroidism, unspecified: Secondary | ICD-10-CM | POA: Diagnosis not present

## 2024-02-20 DIAGNOSIS — I10 Essential (primary) hypertension: Secondary | ICD-10-CM | POA: Diagnosis not present

## 2024-02-20 DIAGNOSIS — E039 Hypothyroidism, unspecified: Secondary | ICD-10-CM | POA: Diagnosis not present

## 2024-02-20 DIAGNOSIS — E782 Mixed hyperlipidemia: Secondary | ICD-10-CM | POA: Diagnosis not present

## 2024-02-20 DIAGNOSIS — S51002D Unspecified open wound of left elbow, subsequent encounter: Secondary | ICD-10-CM | POA: Diagnosis not present

## 2024-02-20 DIAGNOSIS — Z9181 History of falling: Secondary | ICD-10-CM | POA: Diagnosis not present

## 2024-02-20 DIAGNOSIS — K219 Gastro-esophageal reflux disease without esophagitis: Secondary | ICD-10-CM | POA: Diagnosis not present

## 2024-02-21 DIAGNOSIS — E039 Hypothyroidism, unspecified: Secondary | ICD-10-CM | POA: Diagnosis not present

## 2024-02-21 DIAGNOSIS — K219 Gastro-esophageal reflux disease without esophagitis: Secondary | ICD-10-CM | POA: Diagnosis not present

## 2024-02-21 DIAGNOSIS — Z9181 History of falling: Secondary | ICD-10-CM | POA: Diagnosis not present

## 2024-02-21 DIAGNOSIS — I10 Essential (primary) hypertension: Secondary | ICD-10-CM | POA: Diagnosis not present

## 2024-02-21 DIAGNOSIS — S51002D Unspecified open wound of left elbow, subsequent encounter: Secondary | ICD-10-CM | POA: Diagnosis not present

## 2024-02-21 DIAGNOSIS — E782 Mixed hyperlipidemia: Secondary | ICD-10-CM | POA: Diagnosis not present

## 2024-02-25 DIAGNOSIS — S50819A Abrasion of unspecified forearm, initial encounter: Secondary | ICD-10-CM | POA: Diagnosis not present

## 2024-02-25 DIAGNOSIS — I10 Essential (primary) hypertension: Secondary | ICD-10-CM | POA: Diagnosis not present

## 2024-02-25 DIAGNOSIS — N390 Urinary tract infection, site not specified: Secondary | ICD-10-CM | POA: Diagnosis not present

## 2024-02-25 DIAGNOSIS — E559 Vitamin D deficiency, unspecified: Secondary | ICD-10-CM | POA: Diagnosis not present

## 2024-02-25 DIAGNOSIS — Z79899 Other long term (current) drug therapy: Secondary | ICD-10-CM | POA: Diagnosis not present

## 2024-02-28 DIAGNOSIS — E782 Mixed hyperlipidemia: Secondary | ICD-10-CM | POA: Diagnosis not present

## 2024-02-28 DIAGNOSIS — K219 Gastro-esophageal reflux disease without esophagitis: Secondary | ICD-10-CM | POA: Diagnosis not present

## 2024-02-28 DIAGNOSIS — Z9181 History of falling: Secondary | ICD-10-CM | POA: Diagnosis not present

## 2024-02-28 DIAGNOSIS — E039 Hypothyroidism, unspecified: Secondary | ICD-10-CM | POA: Diagnosis not present

## 2024-02-28 DIAGNOSIS — S51002D Unspecified open wound of left elbow, subsequent encounter: Secondary | ICD-10-CM | POA: Diagnosis not present

## 2024-02-28 DIAGNOSIS — I10 Essential (primary) hypertension: Secondary | ICD-10-CM | POA: Diagnosis not present

## 2024-03-03 DIAGNOSIS — K219 Gastro-esophageal reflux disease without esophagitis: Secondary | ICD-10-CM | POA: Diagnosis not present

## 2024-03-03 DIAGNOSIS — E782 Mixed hyperlipidemia: Secondary | ICD-10-CM | POA: Diagnosis not present

## 2024-03-03 DIAGNOSIS — E039 Hypothyroidism, unspecified: Secondary | ICD-10-CM | POA: Diagnosis not present

## 2024-03-03 DIAGNOSIS — Z9181 History of falling: Secondary | ICD-10-CM | POA: Diagnosis not present

## 2024-03-03 DIAGNOSIS — S51002D Unspecified open wound of left elbow, subsequent encounter: Secondary | ICD-10-CM | POA: Diagnosis not present

## 2024-03-03 DIAGNOSIS — I10 Essential (primary) hypertension: Secondary | ICD-10-CM | POA: Diagnosis not present

## 2024-03-04 DIAGNOSIS — S51002D Unspecified open wound of left elbow, subsequent encounter: Secondary | ICD-10-CM | POA: Diagnosis not present

## 2024-03-06 DIAGNOSIS — S51002D Unspecified open wound of left elbow, subsequent encounter: Secondary | ICD-10-CM | POA: Diagnosis not present

## 2024-03-06 DIAGNOSIS — K219 Gastro-esophageal reflux disease without esophagitis: Secondary | ICD-10-CM | POA: Diagnosis not present

## 2024-03-06 DIAGNOSIS — E039 Hypothyroidism, unspecified: Secondary | ICD-10-CM | POA: Diagnosis not present

## 2024-03-06 DIAGNOSIS — E782 Mixed hyperlipidemia: Secondary | ICD-10-CM | POA: Diagnosis not present

## 2024-03-06 DIAGNOSIS — I10 Essential (primary) hypertension: Secondary | ICD-10-CM | POA: Diagnosis not present

## 2024-03-06 DIAGNOSIS — Z9181 History of falling: Secondary | ICD-10-CM | POA: Diagnosis not present

## 2024-03-09 ENCOUNTER — Ambulatory Visit: Payer: Medicare Other | Admitting: Family Medicine

## 2024-03-10 DIAGNOSIS — E782 Mixed hyperlipidemia: Secondary | ICD-10-CM | POA: Diagnosis not present

## 2024-03-10 DIAGNOSIS — E039 Hypothyroidism, unspecified: Secondary | ICD-10-CM | POA: Diagnosis not present

## 2024-03-10 DIAGNOSIS — S51002D Unspecified open wound of left elbow, subsequent encounter: Secondary | ICD-10-CM | POA: Diagnosis not present

## 2024-03-10 DIAGNOSIS — K219 Gastro-esophageal reflux disease without esophagitis: Secondary | ICD-10-CM | POA: Diagnosis not present

## 2024-03-10 DIAGNOSIS — Z9181 History of falling: Secondary | ICD-10-CM | POA: Diagnosis not present

## 2024-03-10 DIAGNOSIS — I10 Essential (primary) hypertension: Secondary | ICD-10-CM | POA: Diagnosis not present

## 2024-03-11 DIAGNOSIS — I10 Essential (primary) hypertension: Secondary | ICD-10-CM | POA: Diagnosis not present

## 2024-03-13 DIAGNOSIS — E782 Mixed hyperlipidemia: Secondary | ICD-10-CM | POA: Diagnosis not present

## 2024-03-13 DIAGNOSIS — I10 Essential (primary) hypertension: Secondary | ICD-10-CM | POA: Diagnosis not present

## 2024-03-13 DIAGNOSIS — S51002D Unspecified open wound of left elbow, subsequent encounter: Secondary | ICD-10-CM | POA: Diagnosis not present

## 2024-03-13 DIAGNOSIS — K219 Gastro-esophageal reflux disease without esophagitis: Secondary | ICD-10-CM | POA: Diagnosis not present

## 2024-03-13 DIAGNOSIS — E039 Hypothyroidism, unspecified: Secondary | ICD-10-CM | POA: Diagnosis not present

## 2024-03-13 DIAGNOSIS — Z9181 History of falling: Secondary | ICD-10-CM | POA: Diagnosis not present

## 2024-03-17 DIAGNOSIS — R5381 Other malaise: Secondary | ICD-10-CM | POA: Diagnosis not present

## 2024-03-17 DIAGNOSIS — I951 Orthostatic hypotension: Secondary | ICD-10-CM | POA: Diagnosis not present

## 2024-03-17 DIAGNOSIS — E039 Hypothyroidism, unspecified: Secondary | ICD-10-CM | POA: Diagnosis not present

## 2024-03-19 DIAGNOSIS — I10 Essential (primary) hypertension: Secondary | ICD-10-CM | POA: Diagnosis not present

## 2024-03-19 DIAGNOSIS — Z9181 History of falling: Secondary | ICD-10-CM | POA: Diagnosis not present

## 2024-03-19 DIAGNOSIS — S51002D Unspecified open wound of left elbow, subsequent encounter: Secondary | ICD-10-CM | POA: Diagnosis not present

## 2024-03-19 DIAGNOSIS — E782 Mixed hyperlipidemia: Secondary | ICD-10-CM | POA: Diagnosis not present

## 2024-03-19 DIAGNOSIS — E039 Hypothyroidism, unspecified: Secondary | ICD-10-CM | POA: Diagnosis not present

## 2024-03-19 DIAGNOSIS — K219 Gastro-esophageal reflux disease without esophagitis: Secondary | ICD-10-CM | POA: Diagnosis not present

## 2024-03-23 DIAGNOSIS — E782 Mixed hyperlipidemia: Secondary | ICD-10-CM | POA: Diagnosis not present

## 2024-03-23 DIAGNOSIS — E039 Hypothyroidism, unspecified: Secondary | ICD-10-CM | POA: Diagnosis not present

## 2024-03-23 DIAGNOSIS — K219 Gastro-esophageal reflux disease without esophagitis: Secondary | ICD-10-CM | POA: Diagnosis not present

## 2024-03-23 DIAGNOSIS — Z9181 History of falling: Secondary | ICD-10-CM | POA: Diagnosis not present

## 2024-03-23 DIAGNOSIS — S51002D Unspecified open wound of left elbow, subsequent encounter: Secondary | ICD-10-CM | POA: Diagnosis not present

## 2024-03-23 DIAGNOSIS — I10 Essential (primary) hypertension: Secondary | ICD-10-CM | POA: Diagnosis not present

## 2024-03-24 DIAGNOSIS — Z9181 History of falling: Secondary | ICD-10-CM | POA: Diagnosis not present

## 2024-03-24 DIAGNOSIS — I10 Essential (primary) hypertension: Secondary | ICD-10-CM | POA: Diagnosis not present

## 2024-03-24 DIAGNOSIS — S51002D Unspecified open wound of left elbow, subsequent encounter: Secondary | ICD-10-CM | POA: Diagnosis not present

## 2024-03-24 DIAGNOSIS — E039 Hypothyroidism, unspecified: Secondary | ICD-10-CM | POA: Diagnosis not present

## 2024-03-24 DIAGNOSIS — I951 Orthostatic hypotension: Secondary | ICD-10-CM | POA: Diagnosis not present

## 2024-03-24 DIAGNOSIS — K219 Gastro-esophageal reflux disease without esophagitis: Secondary | ICD-10-CM | POA: Diagnosis not present

## 2024-03-24 DIAGNOSIS — R5381 Other malaise: Secondary | ICD-10-CM | POA: Diagnosis not present

## 2024-03-24 DIAGNOSIS — E782 Mixed hyperlipidemia: Secondary | ICD-10-CM | POA: Diagnosis not present

## 2024-03-26 DIAGNOSIS — B351 Tinea unguium: Secondary | ICD-10-CM | POA: Diagnosis not present

## 2024-03-26 DIAGNOSIS — L600 Ingrowing nail: Secondary | ICD-10-CM | POA: Diagnosis not present

## 2024-03-26 DIAGNOSIS — L603 Nail dystrophy: Secondary | ICD-10-CM | POA: Diagnosis not present

## 2024-03-26 DIAGNOSIS — L84 Corns and callosities: Secondary | ICD-10-CM | POA: Diagnosis not present

## 2024-04-08 ENCOUNTER — Other Ambulatory Visit: Payer: Self-pay | Admitting: Family Medicine

## 2024-04-08 DIAGNOSIS — E039 Hypothyroidism, unspecified: Secondary | ICD-10-CM

## 2024-04-08 DIAGNOSIS — F03918 Unspecified dementia, unspecified severity, with other behavioral disturbance: Secondary | ICD-10-CM

## 2024-04-08 DIAGNOSIS — R413 Other amnesia: Secondary | ICD-10-CM

## 2024-04-08 DIAGNOSIS — K219 Gastro-esophageal reflux disease without esophagitis: Secondary | ICD-10-CM

## 2024-04-09 ENCOUNTER — Other Ambulatory Visit: Payer: Self-pay | Admitting: Family Medicine

## 2024-04-09 DIAGNOSIS — E782 Mixed hyperlipidemia: Secondary | ICD-10-CM

## 2024-04-14 DIAGNOSIS — K219 Gastro-esophageal reflux disease without esophagitis: Secondary | ICD-10-CM | POA: Diagnosis not present

## 2024-04-14 DIAGNOSIS — I951 Orthostatic hypotension: Secondary | ICD-10-CM | POA: Diagnosis not present

## 2024-04-28 DIAGNOSIS — I10 Essential (primary) hypertension: Secondary | ICD-10-CM | POA: Diagnosis not present

## 2024-05-12 DIAGNOSIS — E039 Hypothyroidism, unspecified: Secondary | ICD-10-CM | POA: Diagnosis not present

## 2024-05-12 DIAGNOSIS — K219 Gastro-esophageal reflux disease without esophagitis: Secondary | ICD-10-CM | POA: Diagnosis not present

## 2024-08-27 ENCOUNTER — Ambulatory Visit: Payer: Medicare Other
